# Patient Record
Sex: Male | Born: 1935 | Race: White | Hispanic: No | Marital: Married | Smoking: Former smoker
Health system: Southern US, Community
[De-identification: ages and names within clinical notes are randomized; demographics above are authoritative.]

## PROBLEM LIST (undated history)

## (undated) DIAGNOSIS — K5790 Diverticulosis of intestine, part unspecified, without perforation or abscess without bleeding: Secondary | ICD-10-CM

## (undated) DIAGNOSIS — K4090 Unilateral inguinal hernia, without obstruction or gangrene, not specified as recurrent: Secondary | ICD-10-CM

## (undated) DIAGNOSIS — N400 Enlarged prostate without lower urinary tract symptoms: Secondary | ICD-10-CM

## (undated) DIAGNOSIS — K219 Gastro-esophageal reflux disease without esophagitis: Secondary | ICD-10-CM

## (undated) DIAGNOSIS — K759 Inflammatory liver disease, unspecified: Secondary | ICD-10-CM

## (undated) DIAGNOSIS — B1910 Unspecified viral hepatitis B without hepatic coma: Secondary | ICD-10-CM

## (undated) DIAGNOSIS — N429 Disorder of prostate, unspecified: Secondary | ICD-10-CM

## (undated) DIAGNOSIS — F329 Major depressive disorder, single episode, unspecified: Secondary | ICD-10-CM

## (undated) DIAGNOSIS — G629 Polyneuropathy, unspecified: Secondary | ICD-10-CM

## (undated) DIAGNOSIS — I2510 Atherosclerotic heart disease of native coronary artery without angina pectoris: Secondary | ICD-10-CM

## (undated) DIAGNOSIS — M1990 Unspecified osteoarthritis, unspecified site: Secondary | ICD-10-CM

## (undated) DIAGNOSIS — F32A Depression, unspecified: Secondary | ICD-10-CM

## (undated) DIAGNOSIS — K635 Polyp of colon: Secondary | ICD-10-CM

## (undated) DIAGNOSIS — F419 Anxiety disorder, unspecified: Secondary | ICD-10-CM

## (undated) DIAGNOSIS — I10 Essential (primary) hypertension: Secondary | ICD-10-CM

## (undated) DIAGNOSIS — I519 Heart disease, unspecified: Secondary | ICD-10-CM

## (undated) DIAGNOSIS — E7800 Pure hypercholesterolemia, unspecified: Secondary | ICD-10-CM

## (undated) HISTORY — DX: Essential (primary) hypertension: I10

## (undated) HISTORY — DX: Pure hypercholesterolemia, unspecified: E78.00

## (undated) HISTORY — PX: PROSTATE BIOPSY: SHX241

## (undated) HISTORY — PX: CORONARY ANGIOPLASTY: SHX604

## (undated) HISTORY — PX: HERNIA REPAIR: SHX51

## (undated) HISTORY — PX: WISDOM TOOTH EXTRACTION: SHX21

## (undated) HISTORY — PX: TONSILLECTOMY: SUR1361

## (undated) HISTORY — PX: COLONOSCOPY: SHX174

## (undated) HISTORY — DX: Heart disease, unspecified: I51.9

## (undated) HISTORY — PX: OTHER SURGICAL HISTORY: SHX169

---

## 1959-06-07 HISTORY — PX: OTHER SURGICAL HISTORY: SHX169

## 2000-01-05 HISTORY — PX: CARDIAC CATHETERIZATION: SHX172

## 2004-04-15 ENCOUNTER — Ambulatory Visit: Payer: Self-pay | Admitting: Gastroenterology

## 2005-03-18 ENCOUNTER — Emergency Department: Payer: Self-pay | Admitting: General Practice

## 2007-06-07 HISTORY — PX: PICC LINE REMOVAL (ARMC HX): HXRAD1261

## 2007-06-07 HISTORY — PX: PICC LINE INSERTION: CATH118290

## 2008-04-10 ENCOUNTER — Emergency Department: Payer: Self-pay | Admitting: Emergency Medicine

## 2008-04-14 ENCOUNTER — Inpatient Hospital Stay: Payer: Self-pay | Admitting: Endocrinology

## 2008-07-24 ENCOUNTER — Ambulatory Visit: Payer: Self-pay | Admitting: Unknown Physician Specialty

## 2011-02-16 ENCOUNTER — Ambulatory Visit: Payer: Self-pay | Admitting: Sports Medicine

## 2011-06-18 ENCOUNTER — Ambulatory Visit: Payer: Self-pay | Admitting: Sports Medicine

## 2012-04-04 DIAGNOSIS — N403 Nodular prostate with lower urinary tract symptoms: Secondary | ICD-10-CM | POA: Insufficient documentation

## 2013-03-20 ENCOUNTER — Encounter: Payer: Self-pay | Admitting: Podiatry

## 2013-03-20 ENCOUNTER — Ambulatory Visit (INDEPENDENT_AMBULATORY_CARE_PROVIDER_SITE_OTHER): Payer: Medicare Other | Admitting: Podiatry

## 2013-03-20 VITALS — BP 168/90 | HR 66 | Resp 16 | Ht 70.0 in | Wt 185.0 lb

## 2013-03-20 DIAGNOSIS — G5760 Lesion of plantar nerve, unspecified lower limb: Secondary | ICD-10-CM

## 2013-03-20 DIAGNOSIS — G5762 Lesion of plantar nerve, left lower limb: Secondary | ICD-10-CM

## 2013-03-20 DIAGNOSIS — D219 Benign neoplasm of connective and other soft tissue, unspecified: Secondary | ICD-10-CM

## 2013-03-20 DIAGNOSIS — D3610 Benign neoplasm of peripheral nerves and autonomic nervous system, unspecified: Secondary | ICD-10-CM

## 2013-03-20 NOTE — Progress Notes (Signed)
Steven Bridges presents today with a chief complaint of the same kind of  pain that had of the fourth toe before. It feels like electrical zingers. He's attends for toes with curves around. He denies any trauma to the left foot.  Objective: Vital signs are stable he is alert and oriented x3. He has pain on palpation to the third interdigital space of the left foot. Hammertoe is noted to the fourth digit left foot. Pulses are palpable left.  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  Plan: Injection dehydrated alcohol third interdigital space today followup with him in one month. This was his first dehydrated alcohol injection.

## 2013-04-22 ENCOUNTER — Ambulatory Visit: Payer: Medicare Other | Admitting: Podiatry

## 2013-05-06 ENCOUNTER — Ambulatory Visit: Payer: Medicare Other | Admitting: Podiatry

## 2013-07-08 ENCOUNTER — Encounter: Payer: Self-pay | Admitting: Podiatry

## 2013-07-08 ENCOUNTER — Ambulatory Visit (INDEPENDENT_AMBULATORY_CARE_PROVIDER_SITE_OTHER): Payer: Medicare Other | Admitting: Podiatry

## 2013-07-08 VITALS — BP 125/74 | HR 78 | Resp 16 | Ht 70.0 in | Wt 188.0 lb

## 2013-07-08 DIAGNOSIS — G5760 Lesion of plantar nerve, unspecified lower limb: Secondary | ICD-10-CM

## 2013-07-08 DIAGNOSIS — G588 Other specified mononeuropathies: Secondary | ICD-10-CM

## 2013-07-08 MED ORDER — IBUPROFEN 600 MG PO TABS: 600.0000 mg | ORAL_TABLET | Freq: Three times a day (TID) | ORAL | Status: DC | PRN

## 2013-07-08 NOTE — Progress Notes (Signed)
Steven Bridges presents today for followup of his neuroma to the third interdigital space of the left foot. I have not seen him since October 2014. He states that I thought I was doing well so I did come back. I suppose I should have continued the therapy. He relates that the pain in his left foot has become worse and is even painful in bed at night keeping him from sleeping comfortably.  Objective: Pulses are strongly palpable to the left lower extremity today. There is no erythema edema cellulitis drainage or odor. Palpable Mulder's click to the third interdigital space of the left foot with severe pain.  Assessment: Neuroma third interdigital space left foot.  Plan: Started over with the dehydrated alcohol injections today to the third interdigital space of the left foot and I will followup with him in 3 weeks

## 2013-07-29 ENCOUNTER — Ambulatory Visit (INDEPENDENT_AMBULATORY_CARE_PROVIDER_SITE_OTHER): Payer: Medicare Other | Admitting: Podiatry

## 2013-07-29 VITALS — BP 129/75 | HR 79 | Resp 16 | Ht 70.0 in | Wt 190.0 lb

## 2013-07-29 DIAGNOSIS — G5760 Lesion of plantar nerve, unspecified lower limb: Secondary | ICD-10-CM

## 2013-07-29 DIAGNOSIS — G588 Other specified mononeuropathies: Secondary | ICD-10-CM

## 2013-07-29 NOTE — Progress Notes (Signed)
He states that based on her history I know we need another injection. He is referring to his neuroma to the third interdigital space of the left foot if which we started our dehydrated alcohol once again last visit.  Objective: Vital signs are stable he is alert and oriented x3. Pulses are palpable left foot. He has pain with a palpable Mulder's click to the third interdigital space of the left foot.  Assessment: Neuroma third interdigital space left foot.  Plan: Seconds injection of dehydrated alcohol third interdigital space left foot

## 2013-08-19 ENCOUNTER — Ambulatory Visit (INDEPENDENT_AMBULATORY_CARE_PROVIDER_SITE_OTHER): Payer: Medicare Other | Admitting: Podiatry

## 2013-08-19 VITALS — BP 142/75 | HR 69 | Resp 16 | Ht 70.0 in | Wt 183.0 lb

## 2013-08-19 DIAGNOSIS — G588 Other specified mononeuropathies: Secondary | ICD-10-CM

## 2013-08-19 DIAGNOSIS — D3610 Benign neoplasm of peripheral nerves and autonomic nervous system, unspecified: Secondary | ICD-10-CM

## 2013-08-19 DIAGNOSIS — D219 Benign neoplasm of connective and other soft tissue, unspecified: Secondary | ICD-10-CM

## 2013-08-19 DIAGNOSIS — G5760 Lesion of plantar nerve, unspecified lower limb: Secondary | ICD-10-CM

## 2013-08-19 NOTE — Progress Notes (Signed)
Steven Bridges presents today for followup of his neuroma to the third interdigital space of the left foot. He states it is doing much better I wonder flava need the injection.  Objective: Pulses are palpable left foot. He has pain on palpation with a palpable Mulder's click third interdigital space of the left foot.  Assessment: Neuroma third interdigital space left foot.  Third injection of dehydrated alcohol left foot. Followup with him in 3 weeks

## 2013-09-09 ENCOUNTER — Ambulatory Visit: Payer: Medicare Other | Admitting: Podiatry

## 2013-09-16 ENCOUNTER — Ambulatory Visit: Payer: Medicare Other | Admitting: Podiatry

## 2013-09-18 ENCOUNTER — Ambulatory Visit: Payer: Medicare Other | Admitting: Podiatry

## 2013-09-23 ENCOUNTER — Ambulatory Visit: Payer: Medicare Other | Admitting: Podiatry

## 2013-09-30 ENCOUNTER — Ambulatory Visit: Payer: Self-pay | Admitting: Unknown Physician Specialty

## 2013-10-02 LAB — PATHOLOGY REPORT

## 2013-10-03 ENCOUNTER — Ambulatory Visit: Payer: Medicare Other | Admitting: Podiatry

## 2013-10-07 DIAGNOSIS — R972 Elevated prostate specific antigen [PSA]: Secondary | ICD-10-CM | POA: Insufficient documentation

## 2013-10-10 ENCOUNTER — Encounter: Payer: Self-pay | Admitting: Podiatry

## 2013-10-10 ENCOUNTER — Ambulatory Visit (INDEPENDENT_AMBULATORY_CARE_PROVIDER_SITE_OTHER): Payer: Medicare Other | Admitting: Podiatry

## 2013-10-10 VITALS — BP 128/72 | HR 96 | Resp 16 | Wt 183.0 lb

## 2013-10-10 DIAGNOSIS — G5760 Lesion of plantar nerve, unspecified lower limb: Secondary | ICD-10-CM

## 2013-10-10 DIAGNOSIS — D3610 Benign neoplasm of peripheral nerves and autonomic nervous system, unspecified: Secondary | ICD-10-CM

## 2013-10-10 DIAGNOSIS — G588 Other specified mononeuropathies: Secondary | ICD-10-CM

## 2013-10-11 NOTE — Progress Notes (Signed)
He presents today for followup of his neuroma third interdigital space of his left foot. He states that it seems to be 100% better.  Objective: Pulses are palpable bilateral. No pain on palpation third interdigital space of the left foot.  Assessment: Well-healing neuroma third interdigital space left.  Plan: Followup with me on an as-needed basis.

## 2014-06-26 ENCOUNTER — Ambulatory Visit (INDEPENDENT_AMBULATORY_CARE_PROVIDER_SITE_OTHER): Payer: Medicare Other | Admitting: Podiatry

## 2014-06-26 VITALS — BP 135/69 | HR 67 | Resp 16

## 2014-06-26 DIAGNOSIS — D3610 Benign neoplasm of peripheral nerves and autonomic nervous system, unspecified: Secondary | ICD-10-CM

## 2014-06-26 DIAGNOSIS — G5762 Lesion of plantar nerve, left lower limb: Secondary | ICD-10-CM

## 2014-06-26 NOTE — Patient Instructions (Signed)
Morton's Neuroma in Sports  (Interdigital Plantar Neuroma) Morton's neuroma is a condition of the nervous system that results in pain or loss of feeling in the toes. The disease is caused by the bones of the foot squeezing the nerve that runs between two toes (interdigital nerve). The third and fourth toes are most likely to be affected by this disease. SYMPTOMS   Tingling, numbness, burning, or electric shocks in the front of the foot, often involving the third and fourth toes, although it may involve any other pair of toes.  Pain and tenderness in the front of the foot, that gets worse when walking.  Pain that gets worse when pressure is applied to the foot (wearing shoes).  Severe pain in the front of the foot, when standing on the front of the foot (on tiptoes), such as with running, jumping, pivoting, or dancing. CAUSES  Morton's neuroma is caused by swelling of the nerve between two toes. This swelling causes the nerve to be pinched between the bones of the foot. RISK INCREASES WITH:  Recurring foot or ankle injuries.  Poor fitting or worn shoes, with minimal padding and shock absorbers.  Loose ligaments of the foot, causing thickening of the nerve.  Poor foot strength and flexibility. PREVENTION  Warm up and stretch properly before activity.  Maintain physical fitness:  Foot and ankle flexibility.  Muscle strength and endurance.  Cardiovascular fitness.  Wear properly fitted and padded shoes.  Wear arch supports (orthotics), when needed. PROGNOSIS  If treated properly, Morton's neuroma can usually be cured with non-surgical treatment. For certain cases, surgery may be needed. RELATED COMPLICATIONS  Permanent numbness and pain in the foot.  Inability to participate in athletics, because of pain. TREATMENT Treatment first involves stopping any activities that make the symptoms worse. The use of ice and medicine will help reduce pain and inflammation. Wearing shoes  with a wide toe box, and an orthotic arch support or metatarsal bar, may also reduce pain. Your caregiver may give you a corticosteroid injection, to further reduce inflammation. If non-surgical treatment is unsuccessful, surgery may be needed. Surgery to fix Morton's neuroma is often performed as an outpatient procedure, meaning you can go home the same day as the surgery. The procedure involves removing the source of pressure on the nerve. If it is necessary to remove the nerve, you can expect persistent numbness. MEDICATION  If pain medicine is needed, nonsteroidal anti-inflammatory medicines (aspirin and ibuprofen), or other minor pain relievers (acetaminophen), are often advised.  Do not take pain medicine for 7 days before surgery.  Prescription pain relievers are usually prescribed only after surgery. Use only as directed and only as much as you need.  Corticosteroid injections are used in extreme cases, to reduce inflammation. These injections should be done only if necessary, because they may be given only a limited number of times. HEAT AND COLD  Cold treatment (icing) should be applied for 10 to 15 minutes every 2 to 3 hours for inflammation and pain, and immediately after activity that aggravates your symptoms. Use ice packs or an ice massage.  Heat treatment may be used before performing stretching and strengthening activities prescribed by your caregiver, physical therapist, or athletic trainer. Use a heat pack or a warm water soak. SEEK MEDICAL CARE IF:   Symptoms get worse or do not improve in 2 weeks, despite treatment.  After surgery you develop increasing pain, swelling, redness, increased warmth, bleeding, drainage of fluids, or fever.  New, unexplained symptoms develop. (  Drugs used in treatment may produce side effects.) Document Released: 03/30/2005 Document Revised: 08/15/2011 Document Reviewed: 09/04/2008 Exit[employer] Patient Information 2015 [employer]. This  information is not intended to replace advice given to you by your health care provider. Make sure you discuss any questions you have with your health care provider.  

## 2014-06-26 NOTE — Progress Notes (Signed)
Patient ID: Beni Turrell [facility] Sr., male   DOB: 03-May-1935, 80 y.o.   MRN: [id number]  Subjective: 80 year old male presents the office today with complaints of left fourth toe numbness and pain in the third interspace and the left foot. He states that last night he started having increased symptoms over this area. He states that the pain pills exactly the same as it did before when he was treated for neuroma. He denies any recent injury or trauma to the area. He states that he has numbness to his third and fourth toes. Also he states that he believes that his neuropathy is getting worse. 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 No other complaints at this time.  Objective: AAO 3, NAD DP/PT pulses palpable bilaterally, CRT less than 3 seconds Protective sensation appears to be intact with Derrel Nip monofilament, vibratory sensation is altered, Achilles tendon reflex intact. There is tenderness to palpation along the left third interspace with reproduction of symptoms of numbness into the third and fourth toe. There is not appear to be any reproduction the medial to lateral compression. No definitive neuroma is palpable. There is no areas of pinpoint bony tenderness or pain with vibratory sensation over the metatarsals or digits or to any other areas of the foot bilaterally. There is no overlying edema, erythema, increase in warmth. MMT 5/5, ROM WNL No open lesions or pre-ulcerative lesions. No pain with calf compression, swelling, warmth, erythema.  Assessment: 80 year old male with left third interspace likely neuroma  Plan: -Discussed x-rays however patient wishes to hold off as he states that this is the same pain that he had before with a neuroma. -Discussed various treatment options. At this time elected to proceed with an alcohol sclerosing injection which he had previously. Risks and complications of the injection were discussed the patient and he he verbally consents to the  procedure. Under sterile conditions a dehydrated alcohol injection was infiltrated into the left third interspace. Band-Aid was applied. Patient tolerated the injection well without complications. Post injection care was discussed with the patient. -Neuroma pads were dispensed. -Discussed shoe gear modifications. -Discussed possible treatments for worsening neuropathy. He wishes to hold off at this time. -Follow-up in 4 weeks or sooner should any problems arise. In the meantime occurs call the office with any questions, concerns, change in symptoms.

## 2014-07-24 ENCOUNTER — Encounter: Payer: Self-pay | Admitting: Podiatry

## 2014-07-24 ENCOUNTER — Ambulatory Visit (INDEPENDENT_AMBULATORY_CARE_PROVIDER_SITE_OTHER): Payer: Medicare Other | Admitting: Podiatry

## 2014-07-24 VITALS — BP 143/80 | HR 66 | Resp 18

## 2014-07-24 DIAGNOSIS — G629 Polyneuropathy, unspecified: Secondary | ICD-10-CM

## 2014-07-24 DIAGNOSIS — D3610 Benign neoplasm of peripheral nerves and autonomic nervous system, unspecified: Secondary | ICD-10-CM | POA: Diagnosis not present

## 2014-07-24 MED ORDER — TRIAMCINOLONE ACETONIDE 10 MG/ML IJ SUSP
10.0000 mg | Freq: Once | INTRAMUSCULAR | Status: AC
  Administered 2014-07-24: 10 mg

## 2014-07-25 NOTE — Progress Notes (Signed)
Patient ID: Steven Bridges [facility] Sr., male   DOB: 01/08/1935, 80 y.o.   MRN: [id number]  Subjective: 80 year old male presents the office today for continued evaluation of left fourth toe pain/numbness. He states that after the last injection at resolution of symptoms for a couple weeks however he stated last week he started to have some "zingers" to the fourth toe. He denies any history of injury or trauma to the area recently. He also feels that his neuropathy has been continuing to worsen. He was previously placed on gabapentin however he did not like the side effects. He has not wanted to go on any other medications. No other complaints at this time in no acute changes since last appointment.  Objective: AAO 3, NAD DP/PT pulses palpable, CRT less than 3 seconds Protective sensation mildly decreased with Simms Weinstein monofilament, vibratory sensation is altered, Achilles tendon reflex intact. There is tenderness along the third interspace 3 production symptoms of numbness into the fourth toe particularly. There is no palpable neuroma identified at this time. There is no pain with medial to lateral compression of the metatarsals. There is no areas of pinpoint bony tenderness or pain the vibratory sensation along the metatarsals or digits. There is no pain with MTPJ range of motion. There is mild discomfort along the medial band of the plantar fascia within the arch of the foot. Plantar fascia appears to be intact. There is no pain along the insertion of the calcaneus. There is no overlying edema, erythema, increased warmth. No open lesions or pre-ulcer lesions identified. No pain with calf compression, swelling, warmth, erythema.  Assessment: 80 year old male with likely left third interspace neuroma however it does appear to be resolving  Plan: -Treatment options were discussed including alternatives, risks, complications. -At this time as she has had numerous alcohol sclerosing injections  into the left third interspace discussed possible steroid injection into the area to see if this helps alleviate his symptoms. Risks and complications of injection were discussed the patient for which he understands and verbally consents. Under sterile conditions a total of 1 mL mixture of Kenalog and 0.5% Marcaine plain was infiltrated into the area of maximal tenderness along the third interspace. Then he was applied. Patient tolerated the injection well without complications. Postinjection care was discussed the patient. -Discussed stretching exercises to help with what appears to be some plantar fascial symptoms as well. Discussed shoe gear modifications. -Again had a long discussion with patient in regards neuropathy treatment. He does not want any medical treatment for the neuropathy this time. Recommended follow-up with his primary care physician for this. -Follow-up as needed. In the meantime occurs all the office with questions, concerns, change in symptoms.

## 2014-08-21 ENCOUNTER — Encounter: Payer: Self-pay | Admitting: Podiatry

## 2014-08-21 ENCOUNTER — Ambulatory Visit (INDEPENDENT_AMBULATORY_CARE_PROVIDER_SITE_OTHER): Payer: Medicare Other | Admitting: Podiatry

## 2014-08-21 VITALS — BP 140/80 | HR 66 | Resp 16

## 2014-08-21 DIAGNOSIS — G629 Polyneuropathy, unspecified: Secondary | ICD-10-CM | POA: Diagnosis not present

## 2014-08-21 NOTE — Patient Instructions (Signed)
Will put in a referral for [facility]. If you have not heard about scheduling in the next few days, please call the office.

## 2014-08-21 NOTE — Progress Notes (Signed)
Patient ID: Steven Bridges [facility] Sr., male   DOB: 1935-05-08, 80 y.o.   MRN: [id number]  Subjective: Steven Bridges presents the office today stating that his neuropathy is been progressive over the last 6 weeks. He states that he is starting to have numbness and tingling up to the level of the ankle to both of his feet. He states that he believes this may have been aggravated by the Xanax which she was on previously or the Lipitor. He states that he has stopped the Lipitor over the last week for concerns that it is causing the neuropathy or making it worse. He has not talked to his PCP about this. He is inquiring about other treatments for neuropathy. He previously was on gabapentin although he did not like the side effects and did not continue the medication. He was previous a treated for neuroma on the left foot for which she states that those symptoms have improved. No tenderness over that site at this time. No other complaints at this time.  Objective: AAO 3, NAD DP/PT pulses palpable bilaterally, CRT less than 3 seconds Decreased protective sensation with Simms Weinstein monofilament, decreased vibratory sensation, Achilles tendon reflex intact. There is no areas of pinpoint bony tenderness or pain with vibratory sensation of bilateral lower extremity. There is no overlying edema, erythema, increase in warmth bilaterally. There is no tenderness to palpation over on the intermetatarsal spaces and there is no palpable neuroma identified at this time. There is no pain with medial to lateral compression of the metatarsals bilaterally. MMT 5/5, ROM WNL No open lesions or pre-ulcer lesions identified bilaterally.  No pain with calf compression, swelling, warmth, erythema.  Assessment: 80 year old male with worsening neuropathy symptoms.  Plan: -Treatment options were discussed with the patient going alternatives, risks, complications. -Patient was inquiring about other options for her peripheral  neuropathy. He states that he found a clinic at [facility] for neuropathy. He is also inquiring about possible laser and various ultrasound therapies. At this time we cannot offer these therapies and I discussed with him where he could pursue these if he desired. However given the worsening symptoms I recommend a neurology evaluation. A referral was placed for [facility]. -Strongly recommended the patient to talked his primary care physician before stopping any medications. -Follow-up after neurology evaluation or sooner if any problems are to arise. In the meantime encouraged to call the office with any questions, concerns, changes symptoms.

## 2014-09-03 DIAGNOSIS — R350 Frequency of micturition: Secondary | ICD-10-CM | POA: Insufficient documentation

## 2014-09-03 DIAGNOSIS — R339 Retention of urine, unspecified: Secondary | ICD-10-CM | POA: Insufficient documentation

## 2014-09-09 DIAGNOSIS — N3080 Other cystitis without hematuria: Secondary | ICD-10-CM | POA: Insufficient documentation

## 2015-06-06 ENCOUNTER — Encounter: Payer: Self-pay | Admitting: Emergency Medicine

## 2015-06-06 ENCOUNTER — Ambulatory Visit
Admission: EM | Admit: 2015-06-06 | Discharge: 2015-06-06 | Disposition: A | Discharge status: Home / Self Care | Payer: Medicare Other | Attending: Family Medicine | Admitting: Family Medicine

## 2015-06-06 DIAGNOSIS — J069 Acute upper respiratory infection, unspecified: Secondary | ICD-10-CM | POA: Diagnosis not present

## 2015-06-06 MED ORDER — AZITHROMYCIN 250 MG PO TABS: ORAL_TABLET | ORAL | Status: DC

## 2015-06-06 NOTE — ED Provider Notes (Signed)
Patient presents today with symptoms of mild productive cough, nasal congestion. He's had the symptoms for the last few days. He states that the mucus is thick and colored. He requests a Z-Pak which she took at the beginning of the month for similar symptoms which cleared up. He denies any fever, chest pain, shortness of breath, severe headache, nausea, vomiting, diarrhea, abdominal pain. He is a former smoker. He denies any history of COPD or asthma.  ROS: Negative except mentioned above.  Vitals as per Epic.  GENERAL: NAD HEENT: mild pharyngeal erythema, no exudate, no frontal or maxillary sinus tenderness, no cervical LAD RESP: CTA B CARD: RRR   A/P: URI- Will treat with Z-pk, Claritin prn, Delysm prn, rest, hydration, seek medical attention if symptoms persist or worsen as discussed.  [name], MD 06/06/15 [phone]

## 2015-06-06 NOTE — ED Notes (Signed)
Cough, congested, runny nose, sore throat for 2 days. (Recurrent)

## 2015-08-31 ENCOUNTER — Ambulatory Visit: Payer: Medicare Other

## 2015-08-31 ENCOUNTER — Ambulatory Visit (INDEPENDENT_AMBULATORY_CARE_PROVIDER_SITE_OTHER): Payer: Medicare Other | Admitting: Podiatry

## 2015-08-31 ENCOUNTER — Ambulatory Visit (INDEPENDENT_AMBULATORY_CARE_PROVIDER_SITE_OTHER): Payer: Medicare Other

## 2015-08-31 ENCOUNTER — Telehealth: Payer: Self-pay | Admitting: *Deleted

## 2015-08-31 DIAGNOSIS — I872 Venous insufficiency (chronic) (peripheral): Secondary | ICD-10-CM | POA: Diagnosis not present

## 2015-08-31 DIAGNOSIS — M79672 Pain in left foot: Secondary | ICD-10-CM | POA: Diagnosis not present

## 2015-08-31 DIAGNOSIS — M79671 Pain in right foot: Secondary | ICD-10-CM | POA: Diagnosis not present

## 2015-08-31 NOTE — Progress Notes (Signed)
He presents today with a very recent onset of swelling to his left foot. He states they were both slightly swollen but nothing like this. He states that he thinks he may have overdone it in the yard since he likes to work in the yard a lot. He denies any history of heart disease or kidney disease. But does relate that he has just been placed on an increased dose of his fluid pill.  Objective: Vital signs are stable alert and oriented 3. Pulses are palpable. He has pitting edema to the left lower extremity below the level of the calf and out along the dorsal aspect of the foot. He has no calf pain. Pulses are palpable neurologic sensorium is diminished for Semmes-Weinstein monofilament. Radiographs do not demonstrate any type of osseus abnormalities. No open lesions or wounds.  Assessment: Probable venous insufficiency of the left lower extremity cannot rule out congestive heart issues.  Plan: We are going to send him to vascular for an evaluation of his venous return and I will follow-up with him in the near future.

## 2015-08-31 NOTE — Telephone Encounter (Addendum)
-----   Message from Rip Harbour, [facility] sent at 08/31/2015  9:41 AM EDT ----- Regarding: Vascular studies Patient needs orders and scheduling for venous dopplers and vascular consult with [facility].   Dx: venous insuffiencey bilateral   Thanks!!   08/31/2015-Faxed doppler orders and consultation orders to  vein and Vascular.  09/09/2015-Left message to fax doppler results to [phone].

## 2015-09-07 ENCOUNTER — Ambulatory Visit: Payer: Medicare Other | Admitting: Podiatry

## 2015-09-08 NOTE — Telephone Encounter (Signed)
09/08/2015- patient called asking for nurse to call him back with update on the vein study that he had done at [place] on 08/31/15. Patient wanted nurse to call him with results.  [phone].

## 2015-09-10 ENCOUNTER — Telehealth: Payer: Self-pay | Admitting: *Deleted

## 2015-09-10 DIAGNOSIS — R609 Edema, unspecified: Secondary | ICD-10-CM

## 2015-09-10 NOTE — Telephone Encounter (Addendum)
Dr. 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 I informed pt's wife, Donnajean Lopes of the results and recommendations, because pt was on the line with another doctor's office.  Referral to [facility] [place].  Referral, pt clinical and demographics faxed to [facility].

## 2015-09-16 ENCOUNTER — Telehealth: Payer: Self-pay | Admitting: *Deleted

## 2015-09-16 NOTE — Telephone Encounter (Addendum)
Pt request his doppler results to be sent to Dr. Rogers Blocker and Dr. Marry Guan at fax [phone].  09/17/2015-I spoke with receptionist at [facility] and she stated fax to Vance Peper, PA that is who will be seeing pt on Tuesday 09/22/2015.  I faxed to Vance Peper, PA with pt's request to have doppler results shared with Dr. Skip Estimable.

## 2015-09-17 ENCOUNTER — Telehealth: Payer: Self-pay | Admitting: Podiatry

## 2015-09-17 NOTE — Telephone Encounter (Signed)
Patient called asking for a copy of his vascular study from [facility]. He wants to send it to Dr. Rogers Blocker at [facility]. He is coming by today to sign a release so that we can fax it for him. Could you please fax a copy of that vascular report so that I can do that for him once he signs the release?  Thanks!

## 2015-09-25 ENCOUNTER — Telehealth: Payer: Self-pay | Admitting: Podiatry

## 2015-09-25 NOTE — Telephone Encounter (Signed)
Pt called in said wanting to know if he can get a different RX the RX Dr. Milinda Pointer gave him made his leg swell (Gabapentin)

## 2015-09-28 NOTE — Telephone Encounter (Signed)
Unfortunately if gabapentin is causing swelling then Lyrica will as well.  I would consider cutting the dose in half or stopping it all together.  No other good alternatives.

## 2015-09-28 NOTE — Telephone Encounter (Addendum)
Pt request change in medication the Gabapentin is causing his legs to swell.  I spoke with pt and gave Dr. Stephenie Acres recommendation.  Pt states he takes Gabapentin 400mg  capsules 3 times a day, and he can't half, so I suggest take the morning and evening doses and see how this affected him.  Pt agreed.

## 2015-09-28 NOTE — Telephone Encounter (Signed)
Yes that is a great idea.

## 2015-10-12 ENCOUNTER — Ambulatory Visit (INDEPENDENT_AMBULATORY_CARE_PROVIDER_SITE_OTHER): Payer: Medicare Other | Admitting: Podiatry

## 2015-10-12 ENCOUNTER — Encounter: Payer: Self-pay | Admitting: Podiatry

## 2015-10-12 VITALS — BP 152/90 | HR 69 | Resp 12

## 2015-10-12 DIAGNOSIS — M779 Enthesopathy, unspecified: Principal | ICD-10-CM

## 2015-10-12 DIAGNOSIS — M7751 Other enthesopathy of right foot: Secondary | ICD-10-CM

## 2015-10-12 DIAGNOSIS — M778 Other enthesopathies, not elsewhere classified: Secondary | ICD-10-CM

## 2015-10-14 NOTE — Progress Notes (Signed)
He presents today for follow-up capsulitis forefoot left. He states it is been doing a little better.  Objective: Vital signs stable alert and oriented 3. Pulses are palpable. Neurologic sensorium is intact. Pain on palpation first and second metatarsophalangeal joints left foot.  Assessment: Pain in limb secondary to capsulitis.  Plan: Injected the area today with Kenalog and local anesthetic. Follow-up with him as needed.

## 2015-11-16 ENCOUNTER — Encounter: Payer: Self-pay | Admitting: Podiatry

## 2015-11-16 ENCOUNTER — Ambulatory Visit (INDEPENDENT_AMBULATORY_CARE_PROVIDER_SITE_OTHER): Payer: Medicare Other | Admitting: Podiatry

## 2015-11-16 DIAGNOSIS — M7751 Other enthesopathy of right foot: Secondary | ICD-10-CM | POA: Diagnosis not present

## 2015-11-16 DIAGNOSIS — M778 Other enthesopathies, not elsewhere classified: Secondary | ICD-10-CM

## 2015-11-16 DIAGNOSIS — M779 Enthesopathy, unspecified: Principal | ICD-10-CM

## 2015-11-16 NOTE — Progress Notes (Signed)
He presents today for follow-up of capsulitis to the forefoot right. He states that he stopped the gabapentin because his legs are swelling. He states that the capsulitis over treating him for right foot has resolved 100%.  Objective: Vital signs are stable alert and oriented 3. Pulses are palpable. No pain on palpation or range of motion of the second metatarsal phalangeal joint right.  Assessment: Forefoot capsulitis resolved 100%.  Plan: Follow up with me as needed.

## 2015-11-24 ENCOUNTER — Encounter: Payer: Self-pay | Admitting: Podiatry

## 2016-02-23 DIAGNOSIS — R300 Dysuria: Secondary | ICD-10-CM | POA: Insufficient documentation

## 2016-03-09 ENCOUNTER — Emergency Department
Admission: EM | Admit: 2016-03-09 | Discharge: 2016-03-09 | Disposition: A | Discharge status: Home / Self Care | Payer: Medicare Other | Attending: Student in an Organized Health Care Education/Training Program | Admitting: Student in an Organized Health Care Education/Training Program

## 2016-03-09 ENCOUNTER — Emergency Department: Payer: Medicare Other

## 2016-03-09 ENCOUNTER — Encounter: Payer: Self-pay | Admitting: Emergency Medicine

## 2016-03-09 DIAGNOSIS — R5383 Other fatigue: Secondary | ICD-10-CM | POA: Diagnosis not present

## 2016-03-09 DIAGNOSIS — Z79899 Other long term (current) drug therapy: Secondary | ICD-10-CM | POA: Insufficient documentation

## 2016-03-09 DIAGNOSIS — Z87891 Personal history of nicotine dependence: Secondary | ICD-10-CM | POA: Insufficient documentation

## 2016-03-09 DIAGNOSIS — E860 Dehydration: Secondary | ICD-10-CM | POA: Diagnosis not present

## 2016-03-09 DIAGNOSIS — I10 Essential (primary) hypertension: Secondary | ICD-10-CM | POA: Insufficient documentation

## 2016-03-09 DIAGNOSIS — Z7982 Long term (current) use of aspirin: Secondary | ICD-10-CM | POA: Insufficient documentation

## 2016-03-09 DIAGNOSIS — R531 Weakness: Secondary | ICD-10-CM

## 2016-03-09 HISTORY — DX: Anxiety disorder, unspecified: F41.9

## 2016-03-09 HISTORY — DX: Disorder of prostate, unspecified: N42.9

## 2016-03-09 LAB — BASIC METABOLIC PANEL
Anion gap: 9 (ref 5–15)
BUN: 13 mg/dL (ref 6–20)
CHLORIDE: 96 mmol/L — AB (ref 101–111)
CO2: 26 mmol/L (ref 22–32)
Calcium: 9.1 mg/dL (ref 8.9–10.3)
Creatinine, Ser: 0.98 mg/dL (ref 0.61–1.24)
GFR calc Af Amer: >60 mL/min (ref >60)
GFR calc non Af Amer: >60 mL/min (ref >60)
GLUCOSE: 92 mg/dL (ref 65–99)
POTASSIUM: 4.4 mmol/L (ref 3.5–5.1)
Sodium: 131 mmol/L — ABNORMAL LOW (ref 135–145)

## 2016-03-09 LAB — CBC
HEMATOCRIT: 46.5 % (ref 40.0–52.0)
Hemoglobin: 16.2 g/dL (ref 13.0–18.0)
MCH: 34.6 pg — ABNORMAL HIGH (ref 26.0–34.0)
MCHC: 35 g/dL (ref 32.0–36.0)
MCV: 99 fL (ref 80.0–100.0)
Platelets: 227 10*3/uL (ref 150–440)
RBC: 4.7 MIL/uL (ref 4.40–5.90)
RDW: 13.2 % (ref 11.5–14.5)
WBC: 6.4 10*3/uL (ref 3.8–10.6)

## 2016-03-09 LAB — URINALYSIS COMPLETE WITH MICROSCOPIC (ARMC ONLY)
BACTERIA UA: NONE SEEN
Bilirubin Urine: NEGATIVE
GLUCOSE, UA: NEGATIVE mg/dL
HGB URINE DIPSTICK: NEGATIVE
Ketones, ur: NEGATIVE mg/dL
Leukocytes, UA: NEGATIVE
Nitrite: NEGATIVE
PROTEIN: NEGATIVE mg/dL
SQUAMOUS EPITHELIAL / LPF: NONE SEEN
Specific Gravity, Urine: 1.008 (ref 1.005–1.030)
pH: 7 (ref 5.0–8.0)

## 2016-03-09 LAB — HEPATIC FUNCTION PANEL
ALBUMIN: 3.9 g/dL (ref 3.5–5.0)
ALK PHOS: 85 U/L (ref 38–126)
ALT: 17 U/L (ref 17–63)
AST: 22 U/L (ref 15–41)
Bilirubin, Direct: 0.2 mg/dL (ref 0.1–0.5)
Indirect Bilirubin: 0.7 mg/dL (ref 0.3–0.9)
TOTAL PROTEIN: 7.3 g/dL (ref 6.5–8.1)
Total Bilirubin: 0.9 mg/dL (ref 0.3–1.2)

## 2016-03-09 LAB — TROPONIN I: Troponin I: <0.03 ng/mL (ref <0.03)

## 2016-03-09 LAB — TSH: TSH: 1.077 u[IU]/mL (ref 0.350–4.500)

## 2016-03-09 MED ORDER — SODIUM CHLORIDE 0.9 % IV BOLUS (SEPSIS)
1000.0000 mL | Freq: Once | INTRAVENOUS | Status: AC
  Administered 2016-03-09: 1000 mL via INTRAVENOUS

## 2016-03-09 NOTE — ED Notes (Signed)
Lab notified to add on troponin  

## 2016-03-09 NOTE — ED Provider Notes (Signed)
[facility] Emergency Department Provider Note    None    (approximate)  I have reviewed the triage vital signs and the nursing notes.   HISTORY  Chief Complaint Weakness    HPI Steven Fiebelkorn [facility] Sr. is a 81 y.o. male with recently diagnosed anxiety on Xanax as well as Prozac presents with 24 hours of worsening generalized fatigue and weakness. 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 Vital acuity to go to sleep. Was able to ambulate but felt like he is about to pass out while walking. Denies any shortness of breath or chest pain. No chest pressure. No nausea or vomiting. No numbness or tingling. States he has a history of CAD status post stents and states that the week preceding the heart attack he did feel weak. Denies any lower extremity swelling. No orthopnea. No recent cough. No melena. No diarrhea.  Past Medical History:  Diagnosis Date  . Anxiety   . HBP (high blood pressure)   . Heart trouble   . High cholesterol   . Prostate disorder     Patient Active Problem List   Diagnosis Date Noted  . Neuroma 03/20/2013    Past Surgical History:  Procedure Laterality Date  . COLONOSCOPY    . HEART STENTS    . HERNIA REPAIR     X 4  . LEFT HAND  1961    Prior to Admission medications   Medication Sig Start Date End Date Taking? Authorizing Provider  aspirin 81 MG tablet Take 81 mg by mouth daily.    Historical Provider, MD  atorvastatin (LIPITOR) 10 MG tablet Take 10 mg by mouth daily.    Historical Provider, MD  azithromycin (ZITHROMAX Z-PAK) 250 MG tablet Use as directed for 5 days. 06/06/15   [name], MD  Calcium Citrate (CITRACAL PO) Take by mouth 2 (two) times daily.    Historical Provider, MD  Calcium-Vitamin D (CALTRATE 600 PLUS-VIT D PO) Take by mouth 2 (two) times daily.    Historical Provider, MD  captopril (CAPOTEN) 25 MG tablet Take 25 mg by mouth 2 (two) times daily.     Historical Provider, MD  hydrochlorothiazide (MICROZIDE) 12.5 MG capsule Take 25 mg by mouth daily.     Historical Provider, MD  ibuprofen (ADVIL,MOTRIN) 600 MG tablet Take 1 tablet (600 mg total) by mouth every 8 (eight) hours as needed. 07/08/13   Max T Hyatt, DPM  Lansoprazole (PREVACID PO) Take by mouth. TAKE ONE TABLET ONCE WEEKLY    Historical Provider, MD  Multiple Vitamins-Minerals (CENTRUM SILVER PO) Take by mouth daily.    Historical Provider, MD  nitroGLYCERIN (NITROSTAT) 0.3 MG SL tablet Place 0.3 mg under the tongue as needed for chest pain.    Historical Provider, MD  sildenafil (VIAGRA) 100 MG tablet Take 100 mg by mouth as needed for erectile dysfunction.    Historical Provider, MD  tamsulosin (FLOMAX) 0.4 MG CAPS capsule Take by mouth daily.    Historical Provider, MD  Verapamil HCl CR 200 MG CP24 Take by mouth daily.    Historical Provider, MD    Allergies Review of patient's allergies indicates no known allergies.  [place]: no bleeding disorders  Social History Social History  Substance Use Topics  . Smoking status: Former Smoker    Types: Cigars    Quit date: 12/13/2012  . Smokeless tobacco: Former Systems developer    Quit date: 03/20/1988  .  Alcohol use Yes     Comment: OCCASIONALLY    Review of Systems Patient denies headaches, rhinorrhea, blurry vision, numbness, shortness of breath, chest pain, edema, cough, abdominal pain, nausea, vomiting, diarrhea, dysuria, fevers, rashes or hallucinations unless otherwise stated above in HPI. ____________________________________________   PHYSICAL EXAM:  VITAL SIGNS: Vitals:   03/09/16 1630 03/09/16 1636  BP: (!) 151/81 (!) 151/81  Pulse: 65 67  Resp: 12   Temp:      Constitutional: Alert and oriented. Well appearing and in no acute distress. Eyes: Conjunctivae are normal. PERRL. EOMI. Head: Atraumatic. Nose: No congestion/rhinnorhea. Mouth/Throat: Mucous membranes are moist.  Oropharynx non-erythematous. Neck: No stridor.  Painless ROM. No cervical spine tenderness to palpation Hematological/Lymphatic/Immunilogical: No cervical lymphadenopathy. Cardiovascular: Normal rate, regular rhythm. Grossly normal heart sounds.  Good peripheral circulation. Respiratory: Normal respiratory effort.  No retractions. Lungs CTAB. Gastrointestinal: Soft and nontender. No distention. No abdominal bruits. No CVA tenderness. Genitourinary:  Musculoskeletal: No lower extremity tenderness nor edema.  No joint effusions. Neurologic:  Normal speech and language. No gross focal neurologic deficits are appreciated. No gait instability. Skin:  Skin is warm, dry and intact. No rash noted. Psychiatric: Mood and affect are normal. Speech and behavior are normal.  ____________________________________________   LABS (all labs ordered are listed, but only abnormal results are displayed)  Results for orders placed or performed during the hospital encounter of 03/09/16 (from the past 24 hour(s))  Basic metabolic panel     Status: Abnormal   Collection Time: 03/09/16 12:52 PM  Result Value Ref Range   Sodium 131 (L) 135 - 145 mmol/L   Potassium 4.4 3.5 - 5.1 mmol/L   Chloride 96 (L) 101 - 111 mmol/L   CO2 26 22 - 32 mmol/L   Glucose, Bld 92 65 - 99 mg/dL   BUN 13 6 - 20 mg/dL   Creatinine, Ser 0.98 0.61 - 1.24 mg/dL   Calcium 9.1 8.9 - 10.3 mg/dL   GFR calc non Af Amer >60 >60 mL/min   GFR calc Af Amer >60 >60 mL/min   Anion gap 9 5 - 15  CBC     Status: Abnormal   Collection Time: 03/09/16 12:52 PM  Result Value Ref Range   WBC 6.4 3.8 - 10.6 K/uL   RBC 4.70 4.40 - 5.90 MIL/uL   Hemoglobin 16.2 13.0 - 18.0 g/dL   HCT 46.5 40.0 - 52.0 %   MCV 99.0 80.0 - 100.0 fL   MCH 34.6 (H) 26.0 - 34.0 pg   MCHC 35.0 32.0 - 36.0 g/dL   RDW 13.2 11.5 - 14.5 %   Platelets 227 150 - 440 K/uL  Hepatic function panel     Status: None   Collection Time: 03/09/16 12:52 PM  Result Value Ref Range   Total Protein 7.3 6.5 - 8.1 g/dL   Albumin 3.9  3.5 - 5.0 g/dL   AST 22 15 - 41 U/L   ALT 17 17 - 63 U/L   Alkaline Phosphatase 85 38 - 126 U/L   Total Bilirubin 0.9 0.3 - 1.2 mg/dL   Bilirubin, Direct 0.2 0.1 - 0.5 mg/dL   Indirect Bilirubin 0.7 0.3 - 0.9 mg/dL  TSH     Status: None   Collection Time: 03/09/16 12:52 PM  Result Value Ref Range   TSH 1.077 0.350 - 4.500 uIU/mL  Troponin I     Status: None   Collection Time: 03/09/16 12:52 PM  Result Value Ref Range   Troponin  I <0.03 <0.03 ng/mL  Urinalysis complete, with microscopic     Status: Abnormal   Collection Time: 03/09/16  2:35 PM  Result Value Ref Range   Color, Urine YELLOW (A) YELLOW   APPearance CLEAR (A) CLEAR   Glucose, UA NEGATIVE NEGATIVE mg/dL   Bilirubin Urine NEGATIVE NEGATIVE   Ketones, ur NEGATIVE NEGATIVE mg/dL   Specific Gravity, Urine 1.008 1.005 - 1.030   Hgb urine dipstick NEGATIVE NEGATIVE   pH 7.0 5.0 - 8.0   Protein, ur NEGATIVE NEGATIVE mg/dL   Nitrite NEGATIVE NEGATIVE   Leukocytes, UA NEGATIVE NEGATIVE   RBC / HPF 0-5 0 - 5 RBC/hpf   WBC, UA 0-5 0 - 5 WBC/hpf   Bacteria, UA NONE SEEN NONE SEEN   Squamous Epithelial / LPF NONE SEEN NONE SEEN   Mucous PRESENT   Troponin I     Status: None   Collection Time: 03/09/16  4:02 PM  Result Value Ref Range   Troponin I <0.03 <0.03 ng/mL   ____________________________________________  EKG My review and personal interpretation at Time: 12:45   Indication: weakness  Rate: 75  Rhythm: nsr Axis: normal Other: normal intervals, no acuite ischemia ____________________________________________  RADIOLOGY  I personally reviewed all radiographic images ordered to evaluate for the above acute complaints and reviewed radiology reports and findings.  These findings were personally discussed with the patient.  Please see medical record for radiology report.  ____________________________________________   PROCEDURES  Procedure(s) performed: none    Critical Care performed:  no ____________________________________________   INITIAL IMPRESSION / ASSESSMENT AND PLAN / ED COURSE  Pertinent labs & imaging results that were available during my care of the patient were reviewed by me and considered in my medical decision making (see chart for details).  DDX: Dehydration, ACS, electrolyte abnormality, anemia, dysrhythmia,   Steven MYRON Sr. is a 81 y.o. who presents to the ED with complaint of weakness and fatigue. Patient is on multiple medications with sedating effects. No classic findings or symptoms to suggest ACS the patient does have risk factors.  Does appear mildly dehydrated.  Will give IVF for dehydration.  The patient will be placed on continuous pulse oximetry and telemetry for monitoring.  Laboratory evaluation will be sent to evaluate for the above complaints.     Clinical Course  Comment By Time  Patient reassessed and states he feels much improved. No weakness at this time. Denies any chest pain or pressure. Merlyn Lot, MD 10/04 1442  UA negative. Will Repeat troponin. Merlyn Lot, MD 10/04 1510  Patient reassessed and feels much better. Denies any chest pain or shortness of breath. His repeat troponin is reassuring. Discussed other possibilities including dehydration as well as Xanax as cause of his symptoms. Less consistent with TIAs he had no structural neurodeficits. Discussed follow-up with cardiology and primary care physician regarding further medical recommendations.  Have discussed with the patient and available family all diagnostics and treatments performed thus far and all questions were answered to the best of my ability. The patient demonstrates understanding and agreement with plan.  Merlyn Lot, MD 10/04 1643     ____________________________________________   FINAL CLINICAL IMPRESSION(S) / ED DIAGNOSES  Final diagnoses:  Fatigue, unspecified type  Weakness  Dehydration      NEW MEDICATIONS STARTED DURING  THIS VISIT:  New Prescriptions   No medications on file     Note:  This document was prepared using Dragon voice recognition software and may include unintentional dictation errors.  Merlyn Lot, MD 03/09/16 [phone]

## 2016-03-09 NOTE — ED Triage Notes (Signed)
Pt reports having increased fatigue and weakness since yesterday. Reports he was working at [employer] when he had a near syncopal episode. Pt pale and diaphoretic in triage. Reports having blurred vision since this morning. Reports some trouble taking deep breaths. Denies chest pain.. Pt reports that he started taking Xanax and Prozacfor approximately 8 days ago for new dx of anxiety. Hx stents placed in 2001 and reports having the same feeling then.

## 2016-04-04 ENCOUNTER — Other Ambulatory Visit: Payer: Self-pay | Admitting: Internal Medicine

## 2016-04-04 DIAGNOSIS — H539 Unspecified visual disturbance: Secondary | ICD-10-CM

## 2016-04-15 ENCOUNTER — Ambulatory Visit
Admission: RE | Admit: 2016-04-15 | Discharge: 2016-04-15 | Disposition: A | Discharge status: Home / Self Care | Payer: Medicare Other | Source: Ambulatory Visit | Attending: Internal Medicine | Admitting: Internal Medicine

## 2016-04-15 DIAGNOSIS — G3189 Other specified degenerative diseases of nervous system: Secondary | ICD-10-CM | POA: Diagnosis not present

## 2016-04-15 DIAGNOSIS — H539 Unspecified visual disturbance: Secondary | ICD-10-CM | POA: Diagnosis present

## 2016-04-15 DIAGNOSIS — H532 Diplopia: Secondary | ICD-10-CM | POA: Diagnosis not present

## 2017-02-28 ENCOUNTER — Other Ambulatory Visit: Payer: Self-pay | Admitting: Internal Medicine

## 2017-02-28 DIAGNOSIS — I2510 Atherosclerotic heart disease of native coronary artery without angina pectoris: Secondary | ICD-10-CM

## 2017-03-02 ENCOUNTER — Ambulatory Visit
Admission: RE | Admit: 2017-03-02 | Discharge: 2017-03-02 | Disposition: A | Discharge status: Home / Self Care | Payer: Medicare Other | Source: Ambulatory Visit | Attending: Internal Medicine | Admitting: Internal Medicine

## 2017-03-02 DIAGNOSIS — E7800 Pure hypercholesterolemia, unspecified: Secondary | ICD-10-CM | POA: Insufficient documentation

## 2017-03-02 DIAGNOSIS — I2510 Atherosclerotic heart disease of native coronary artery without angina pectoris: Secondary | ICD-10-CM

## 2017-03-02 DIAGNOSIS — I10 Essential (primary) hypertension: Secondary | ICD-10-CM | POA: Insufficient documentation

## 2017-03-02 DIAGNOSIS — I6523 Occlusion and stenosis of bilateral carotid arteries: Secondary | ICD-10-CM | POA: Diagnosis not present

## 2017-03-02 DIAGNOSIS — R42 Dizziness and giddiness: Secondary | ICD-10-CM | POA: Insufficient documentation

## 2017-03-06 DIAGNOSIS — I779 Disorder of arteries and arterioles, unspecified: Secondary | ICD-10-CM | POA: Insufficient documentation

## 2017-03-06 DIAGNOSIS — I739 Peripheral vascular disease, unspecified: Secondary | ICD-10-CM

## 2017-06-16 DIAGNOSIS — M1711 Unilateral primary osteoarthritis, right knee: Secondary | ICD-10-CM | POA: Insufficient documentation

## 2017-07-11 ENCOUNTER — Emergency Department
Admission: EM | Admit: 2017-07-11 | Discharge: 2017-07-11 | Disposition: A | Discharge status: Home / Self Care | Payer: Medicare Other | Attending: Emergency Medicine | Admitting: Emergency Medicine

## 2017-07-11 DIAGNOSIS — Z79899 Other long term (current) drug therapy: Secondary | ICD-10-CM | POA: Diagnosis not present

## 2017-07-11 DIAGNOSIS — Z87891 Personal history of nicotine dependence: Secondary | ICD-10-CM | POA: Insufficient documentation

## 2017-07-11 DIAGNOSIS — E860 Dehydration: Secondary | ICD-10-CM | POA: Diagnosis not present

## 2017-07-11 DIAGNOSIS — N390 Urinary tract infection, site not specified: Secondary | ICD-10-CM | POA: Diagnosis not present

## 2017-07-11 DIAGNOSIS — R319 Hematuria, unspecified: Secondary | ICD-10-CM

## 2017-07-11 DIAGNOSIS — R3915 Urgency of urination: Secondary | ICD-10-CM | POA: Diagnosis present

## 2017-07-11 DIAGNOSIS — Z7982 Long term (current) use of aspirin: Secondary | ICD-10-CM | POA: Insufficient documentation

## 2017-07-11 LAB — BASIC METABOLIC PANEL
ANION GAP: 7 (ref 5–15)
BUN: 19 mg/dL (ref 6–20)
CALCIUM: 9 mg/dL (ref 8.9–10.3)
CHLORIDE: 98 mmol/L — AB (ref 101–111)
CO2: 28 mmol/L (ref 22–32)
CREATININE: 1.34 mg/dL — AB (ref 0.61–1.24)
GFR calc Af Amer: 56 mL/min — ABNORMAL LOW (ref >60)
GFR calc non Af Amer: 48 mL/min — ABNORMAL LOW (ref >60)
Glucose, Bld: 96 mg/dL (ref 65–99)
Potassium: 5.1 mmol/L (ref 3.5–5.1)
Sodium: 133 mmol/L — ABNORMAL LOW (ref 135–145)

## 2017-07-11 LAB — CBC WITH DIFFERENTIAL/PLATELET
Basophils Absolute: 0 10*3/uL (ref 0–0.1)
Basophils Relative: 0 %
Eosinophils Absolute: 0.1 10*3/uL (ref 0–0.7)
Eosinophils Relative: 1 %
HEMATOCRIT: 43.4 % (ref 40.0–52.0)
HEMOGLOBIN: 14.7 g/dL (ref 13.0–18.0)
Lymphocytes Relative: 10 %
Lymphs Abs: 1.1 10*3/uL (ref 1.0–3.6)
MCH: 34.5 pg — ABNORMAL HIGH (ref 26.0–34.0)
MCHC: 33.8 g/dL (ref 32.0–36.0)
MCV: 102.2 fL — AB (ref 80.0–100.0)
MONO ABS: 1.1 10*3/uL — AB (ref 0.2–1.0)
MONOS PCT: 10 %
NEUTROS ABS: 8.3 10*3/uL — AB (ref 1.4–6.5)
Neutrophils Relative %: 79 %
Platelets: 197 10*3/uL (ref 150–440)
RBC: 4.25 MIL/uL — ABNORMAL LOW (ref 4.40–5.90)
RDW: 13 % (ref 11.5–14.5)
WBC: 10.5 10*3/uL (ref 3.8–10.6)

## 2017-07-11 LAB — URINALYSIS, COMPLETE (UACMP) WITH MICROSCOPIC
Bacteria, UA: NONE SEEN
Bilirubin Urine: NEGATIVE
GLUCOSE, UA: NEGATIVE mg/dL
Ketones, ur: NEGATIVE mg/dL
Nitrite: NEGATIVE
PH: 7 (ref 5.0–8.0)
Protein, ur: >=300 mg/dL — AB
SPECIFIC GRAVITY, URINE: 1.022 (ref 1.005–1.030)
SQUAMOUS EPITHELIAL / LPF: NONE SEEN

## 2017-07-11 MED ORDER — DEXTROSE 5 % IV SOLN
1.0000 g | Freq: Once | INTRAVENOUS | Status: AC
  Administered 2017-07-11: 1 g via INTRAVENOUS
  Filled 2017-07-11: qty 10

## 2017-07-11 MED ORDER — CEPHALEXIN 500 MG PO CAPS: 500.0000 mg | ORAL_CAPSULE | Freq: Three times a day (TID) | ORAL | 0 refills | Status: DC

## 2017-07-11 MED ORDER — SODIUM CHLORIDE 0.9 % IV BOLUS (SEPSIS)
500.0000 mL | Freq: Once | INTRAVENOUS | Status: AC
  Administered 2017-07-11: 500 mL via INTRAVENOUS

## 2017-07-11 NOTE — ED Notes (Signed)
ED Provider at bedside. 

## 2017-07-11 NOTE — Discharge Instructions (Signed)
Please seek medical attention for any high fevers, chest pain, shortness of breath, change in behavior, persistent vomiting, bloody stool or any other new or concerning symptoms.  

## 2017-07-11 NOTE — ED Provider Notes (Signed)
[facility] Emergency Department Provider Note  ____________________________________________   I have reviewed the triage vital signs and the nursing notes.   HISTORY  Chief Complaint Urinary urgency  History limited by: Not Limited   HPI Steven Ganas [facility] Sr. is a 83 y.o. male who presents to the emergency department today because of concern for urinary urgency. The patient states that the symptoms started this morning. Felt the need to urinate throughout the day, however when he does try to urinate only a little comes out. He noticed associated change in the color of his urine. It was dark orange this morning and has become more red throughout the day. He denies any significant pain in the lower abdomen. Denies similar symptoms in the past. No fevers.    Per medical record review patient has a history of prostate disorder.  Past Medical History:  Diagnosis Date  . Anxiety   . HBP (high blood pressure)   . Heart trouble   . High cholesterol   . Prostate disorder     Patient Active Problem List   Diagnosis Date Noted  . Neuroma 03/20/2013    Past Surgical History:  Procedure Laterality Date  . COLONOSCOPY    . HEART STENTS    . HERNIA REPAIR     X 4  . LEFT HAND  1961    Prior to Admission medications   Medication Sig Start Date End Date Taking? Authorizing Provider  aspirin 81 MG tablet Take 81 mg by mouth daily.    [provider]  atorvastatin (LIPITOR) 10 MG tablet Take 10 mg by mouth daily.    [provider]  azithromycin (ZITHROMAX Z-PAK) 250 MG tablet Use as directed for 5 days. 06/06/15   [name], MD  Calcium Citrate (CITRACAL PO) Take by mouth 2 (two) times daily.    [provider]  Calcium-Vitamin D (CALTRATE 600 PLUS-VIT D PO) Take by mouth 2 (two) times daily.    [provider]  captopril (CAPOTEN) 25 MG tablet Take 25 mg by mouth 2 (two) times daily.    [provider]   hydrochlorothiazide (MICROZIDE) 12.5 MG capsule Take 25 mg by mouth daily.     [provider]  ibuprofen (ADVIL,MOTRIN) 600 MG tablet Take 1 tablet (600 mg total) by mouth every 8 (eight) hours as needed. 07/08/13   Hyatt, Max T, DPM  Lansoprazole (PREVACID PO) Take by mouth. TAKE ONE TABLET ONCE WEEKLY    [provider]  Multiple Vitamins-Minerals (CENTRUM SILVER PO) Take by mouth daily.    [provider]  nitroGLYCERIN (NITROSTAT) 0.3 MG SL tablet Place 0.3 mg under the tongue as needed for chest pain.    [provider]  sildenafil (VIAGRA) 100 MG tablet Take 100 mg by mouth as needed for erectile dysfunction.    [provider]  tamsulosin (FLOMAX) 0.4 MG CAPS capsule Take by mouth daily.    [provider]  Verapamil HCl CR 200 MG CP24 Take by mouth daily.    [provider]    Allergies Patient has no known allergies.  No family history on file.  Social History Social History   Tobacco Use  . Smoking status: Former Smoker    Types: Cigars    Last attempt to quit: 12/13/2012    Years since quitting: 4.5  . Smokeless tobacco: Former Systems developer    Quit date: 03/20/1988  Substance Use Topics  . Alcohol use: Yes    Comment:  OCCASIONALLY  . Drug use: No    Review of Systems Constitutional: No fever/chills Eyes: No visual changes. ENT: No sore throat. Cardiovascular: Denies chest pain. Respiratory: Denies shortness of breath. Gastrointestinal: No abdominal pain.  No nausea, no vomiting.  No diarrhea.   Genitourinary: Positive for urinary urgency. Musculoskeletal: Positive for right lower back pain. Skin: Negative for rash. Neurological: Negative for headaches, focal weakness or numbness.  ____________________________________________   PHYSICAL EXAM:  VITAL SIGNS: ED Triage Vitals  Enc Vitals Group     BP 07/11/17 1424 (!) 141/82     Pulse Rate 07/11/17 1424 82     Resp 07/11/17 1424 18     Temp 07/11/17  1424 98.2 F (36.8 C)     Temp Source 07/11/17 1424 Oral     SpO2 07/11/17 1424 96 %     Weight 07/11/17 1412 180 lb (81.6 kg)     Height --      Head Circumference --      Peak Flow --      Pain Score 07/11/17 1411 5    Constitutional: Alert and oriented. Well appearing and in no distress. Eyes: Conjunctivae are normal.  ENT   Head: Normocephalic and atraumatic.   Nose: No congestion/rhinnorhea.   Mouth/Throat: Mucous membranes are moist.   Neck: No stridor. Hematological/Lymphatic/Immunilogical: No cervical lymphadenopathy. Cardiovascular: Normal rate, regular rhythm.  No murmurs, rubs, or gallops.  Respiratory: Normal respiratory effort without tachypnea nor retractions. Breath sounds are clear and equal bilaterally. No wheezes/rales/rhonchi. Gastrointestinal: Soft and non tender. No rebound. No guarding.  Genitourinary: Deferred Musculoskeletal: Normal range of motion in all extremities. No lower extremity edema. Neurologic:  Normal speech and language. No gross focal neurologic deficits are appreciated.  Skin:  Skin is warm, dry and intact. No rash noted. Psychiatric: Mood and affect are normal. Speech and behavior are normal. Patient exhibits appropriate insight and judgment.  ____________________________________________    LABS (pertinent positives/negatives)  UA Large urine, nitrite negative, leukocytes moderate, too numerous to count RBC, WBC  ____________________________________________   EKG  None  ____________________________________________    RADIOLOGY  None  ____________________________________________   PROCEDURES  Procedures  ____________________________________________   INITIAL IMPRESSION / ASSESSMENT AND PLAN / ED COURSE  Pertinent labs & imaging results that were available during my care of the patient were reviewed by me and considered in my medical decision making (see chart for details).  Presented to the emergency  department today because of concerns for urinary urgency.  Patient also had noticed some blood in his urine.  Bladder scan here only revealed 18 mL's.  Patient appeared somewhat dehydrated on blood work but no signs of any significant kidney damage.  Urine does show too numerous to count RBCs and white blood cells.  I do wonder if patient has an underlying infection.  This could explain the urgency as well as the hematuria.  Will plan on treating patient for infection.  Will give dose of IV antibiotics here in the emergency department.  Discussed findings and plan with patient and family.   ____________________________________________   FINAL CLINICAL IMPRESSION(S) / ED DIAGNOSES  Final diagnoses:  Hematuria, unspecified type  Dehydration  Lower urinary tract infectious disease     Note: This dictation was prepared with Dragon dictation. Any transcriptional errors that result from this process are unintentional     Nance Pear, MD 07/11/17 1850

## 2017-07-11 NOTE — ED Notes (Signed)
Pt signed esignature.  D/c inst to pt.  Iv dc'ed.   

## 2017-07-11 NOTE — ED Triage Notes (Signed)
Pt reports that he has been having urge to pee, but then is unable to completely void.  Pt brought in sample of urine with him that has was appears to be blood in it.  Pt is A&Ox4, in NAD.

## 2017-07-11 NOTE — ED Notes (Signed)
Resumed care from samantha rn    Pt reports urinary urgency and pain with urination.  Pt alert.  Family with pt.

## 2017-07-13 LAB — URINE CULTURE

## 2017-07-14 NOTE — Progress Notes (Signed)
ED CULTURE REPORT   83 yo male presented to ED on 2/5 with complaint of urinary urgency and UA showed WBC and Hgb in urine. A urine culture was obtained and discharged with cephalexin 500mg  TID for 10 days. The urine culture resulted 2/8 showing E coli sensitive to cefazolin. I spoke with ED MD Dr. Cinda Quest who agreed that no further intervention was needed.   Results for orders placed or performed during the hospital encounter of 07/11/17  Urine Culture     Status: Abnormal   Collection Time: 07/11/17  2:15 PM  Result Value Ref Range Status   Specimen Description   Final    URINE, RANDOM Performed at [facility], [address] [place], [place] [address]    Special Requests   Final    NONE Performed at [facility], [address] [place], [place] [address]    Culture >=100,000 COLONIES/mL ESCHERICHIA COLI (A)  Final   Report Status 07/13/2017 FINAL  Final   Organism ID, Bacteria ESCHERICHIA COLI (A)  Final      Susceptibility   Escherichia coli - MIC*    AMPICILLIN <=2 SENSITIVE Sensitive     CEFAZOLIN <=4 SENSITIVE Sensitive     CEFTRIAXONE <=1 SENSITIVE Sensitive     CIPROFLOXACIN <=0.25 SENSITIVE Sensitive     GENTAMICIN <=1 SENSITIVE Sensitive     IMIPENEM <=0.25 SENSITIVE Sensitive     NITROFURANTOIN <=16 SENSITIVE Sensitive     TRIMETH/SULFA <=20 SENSITIVE Sensitive     AMPICILLIN/SULBACTAM <=2 SENSITIVE Sensitive     PIP/TAZO <=4 SENSITIVE Sensitive     Extended ESBL NEGATIVE Sensitive     * >=100,000 COLONIES/mL ESCHERICHIA COLI   Lendon Ka, PharmD Pharmacy Resident

## 2017-09-13 ENCOUNTER — Other Ambulatory Visit: Payer: Self-pay

## 2017-09-13 ENCOUNTER — Encounter
Admission: RE | Admit: 2017-09-13 | Discharge: 2017-09-13 | Disposition: A | Discharge status: Home / Self Care | Payer: Medicare Other | Source: Ambulatory Visit | Attending: Orthopedic Surgery | Admitting: Orthopedic Surgery

## 2017-09-13 DIAGNOSIS — Z0183 Encounter for blood typing: Secondary | ICD-10-CM | POA: Diagnosis not present

## 2017-09-13 DIAGNOSIS — Z01818 Encounter for other preprocedural examination: Secondary | ICD-10-CM | POA: Diagnosis present

## 2017-09-13 DIAGNOSIS — Z01810 Encounter for preprocedural cardiovascular examination: Secondary | ICD-10-CM | POA: Diagnosis not present

## 2017-09-13 DIAGNOSIS — I10 Essential (primary) hypertension: Secondary | ICD-10-CM | POA: Insufficient documentation

## 2017-09-13 DIAGNOSIS — Z01812 Encounter for preprocedural laboratory examination: Secondary | ICD-10-CM | POA: Diagnosis not present

## 2017-09-13 DIAGNOSIS — I2510 Atherosclerotic heart disease of native coronary artery without angina pectoris: Secondary | ICD-10-CM | POA: Diagnosis not present

## 2017-09-13 HISTORY — DX: Unilateral inguinal hernia, without obstruction or gangrene, not specified as recurrent: K40.90

## 2017-09-13 HISTORY — DX: Diverticulosis of intestine, part unspecified, without perforation or abscess without bleeding: K57.90

## 2017-09-13 HISTORY — DX: Inflammatory liver disease, unspecified: K75.9

## 2017-09-13 HISTORY — DX: Depression, unspecified: F32.A

## 2017-09-13 HISTORY — DX: Polyp of colon: K63.5

## 2017-09-13 HISTORY — DX: Polyneuropathy, unspecified: G62.9

## 2017-09-13 HISTORY — DX: Benign prostatic hyperplasia without lower urinary tract symptoms: N40.0

## 2017-09-13 HISTORY — DX: Unspecified viral hepatitis B without hepatic coma: B19.10

## 2017-09-13 HISTORY — DX: Gastro-esophageal reflux disease without esophagitis: K21.9

## 2017-09-13 HISTORY — DX: Atherosclerotic heart disease of native coronary artery without angina pectoris: I25.10

## 2017-09-13 HISTORY — DX: Major depressive disorder, single episode, unspecified: F32.9

## 2017-09-13 HISTORY — DX: Unspecified osteoarthritis, unspecified site: M19.90

## 2017-09-13 LAB — URINALYSIS, ROUTINE W REFLEX MICROSCOPIC
BILIRUBIN URINE: NEGATIVE
GLUCOSE, UA: NEGATIVE mg/dL
HGB URINE DIPSTICK: NEGATIVE
KETONES UR: 5 mg/dL — AB
Leukocytes, UA: NEGATIVE
NITRITE: NEGATIVE
Protein, ur: NEGATIVE mg/dL
SPECIFIC GRAVITY, URINE: 1.004 — AB (ref 1.005–1.030)
pH: 6 (ref 5.0–8.0)

## 2017-09-13 LAB — TYPE AND SCREEN
ABO/RH(D): AB POS
ANTIBODY SCREEN: NEGATIVE

## 2017-09-13 LAB — SURGICAL PCR SCREEN
MRSA, PCR: NEGATIVE
Staphylococcus aureus: POSITIVE — AB

## 2017-09-13 LAB — CBC
HCT: 41.4 % (ref 40.0–52.0)
Hemoglobin: 13.8 g/dL (ref 13.0–18.0)
MCH: 34 pg (ref 26.0–34.0)
MCHC: 33.2 g/dL (ref 32.0–36.0)
MCV: 102.3 fL — AB (ref 80.0–100.0)
PLATELETS: 199 10*3/uL (ref 150–440)
RBC: 4.05 MIL/uL — ABNORMAL LOW (ref 4.40–5.90)
RDW: 13.4 % (ref 11.5–14.5)
WBC: 4.6 10*3/uL (ref 3.8–10.6)

## 2017-09-13 LAB — COMPREHENSIVE METABOLIC PANEL
ALBUMIN: 4.2 g/dL (ref 3.5–5.0)
ALT: 19 U/L (ref 17–63)
ANION GAP: 8 (ref 5–15)
AST: 23 U/L (ref 15–41)
Alkaline Phosphatase: 79 U/L (ref 38–126)
BUN: 11 mg/dL (ref 6–20)
CHLORIDE: 96 mmol/L — AB (ref 101–111)
CO2: 29 mmol/L (ref 22–32)
Calcium: 9.1 mg/dL (ref 8.9–10.3)
Creatinine, Ser: 0.66 mg/dL (ref 0.61–1.24)
GFR calc Af Amer: >60 mL/min (ref >60)
Glucose, Bld: 84 mg/dL (ref 65–99)
POTASSIUM: 4.2 mmol/L (ref 3.5–5.1)
Sodium: 133 mmol/L — ABNORMAL LOW (ref 135–145)
TOTAL PROTEIN: 6.8 g/dL (ref 6.5–8.1)
Total Bilirubin: 0.9 mg/dL (ref 0.3–1.2)

## 2017-09-13 LAB — PROTIME-INR
INR: 0.95
PROTHROMBIN TIME: 12.6 s (ref 11.4–15.2)

## 2017-09-13 LAB — APTT: APTT: 31 s (ref 24–36)

## 2017-09-13 LAB — C-REACTIVE PROTEIN: CRP: <0.8 mg/dL (ref <1.0)

## 2017-09-13 LAB — SEDIMENTATION RATE: SED RATE: 4 mm/h (ref 0–20)

## 2017-09-13 NOTE — Patient Instructions (Signed)
Your procedure is scheduled on: Monday, September 25, 2017 Report to Day Surgery on the 2nd floor of the [facility]. To find out your arrival time, please call [phone] between 1PM - 3PM on: Friday, September 22, 2017  REMEMBER: Instructions that are not followed completely may result in serious medical risk, up to and including death; or upon the discretion of your surgeon and anesthesiologist your surgery may need to be rescheduled.  Do not eat food after midnight the night before your procedure.  No gum chewing, lozengers or hard candies.  You may however, drink CLEAR liquids up to 2 hours before you are scheduled to arrive for your surgery. Do not drink anything within 2 hours of the start of your surgery.  Clear liquids include: - water  - apple juice without pulp - clear gatorade - black coffee or tea (Do NOT add anything to the coffee or tea) Do NOT drink anything that is not on this list.  No Alcohol for 24 hours before or after surgery.  No Smoking including e-cigarettes for 24 hours prior to surgery.  No chewable tobacco products for at least 6 hours prior to surgery.  No nicotine patches on the day of surgery.  On the morning of surgery brush your teeth with toothpaste and water, you may rinse your mouth with mouthwash if you wish. Do not swallow any toothpaste or mouthwash.  Notify your doctor if there is any change in your medical condition (cold, fever, infection).  Do not wear jewelry, make-up, hairpins, clips or nail polish.  Do not wear lotions, powders, or perfumes. You may wear deodorant.  Do not shave 48 hours prior to surgery. Men may shave face and neck.  Contacts and dentures may not be worn into surgery.  Do not bring valuables to the hospital, including drivers license, insurance or credit cards.  [facility] is not responsible for any belongings or valuables.   TAKE THESE MEDICATIONS THE MORNING OF SURGERY:  1.  Zoloft  Use CHG Soap as directed  on instruction sheet.  Follow recommendations from Cardiologist regarding stopping Aspirin.  On April 15 - Stop Anti-inflammatories (NSAIDS) such as Advil, Aleve, Ibuprofen, Motrin, Naproxen, Naprosyn and Aspirin based products such as Excedrin, Goodys Powder, BC Powder. (May take Tylenol or Acetaminophen if needed.)  On April 15 - Stop ANY OVER THE COUNTER supplements until after surgery. (May continue multivitamin.)  Wear comfortable clothing (specific to your surgery type) to the hospital.  Plan for stool softeners for home use.  If you are being admitted to the hospital overnight, leave your suitcase in the car. After surgery it may be brought to your room.  Please call [phone] if you have any questions about these instructions.

## 2017-09-14 NOTE — Pre-Procedure Instructions (Signed)
FAXED T STAPH TO DR Marry Guan

## 2017-09-15 LAB — URINE CULTURE
Culture: <10000 — AB
Special Requests: NORMAL

## 2017-09-24 MED ORDER — SODIUM CHLORIDE 0.9 % IV SOLN
1000.0000 mg | INTRAVENOUS | Status: AC
  Administered 2017-09-25: 1000 mg via INTRAVENOUS
  Filled 2017-09-24: qty 10

## 2017-09-24 MED ORDER — CEFAZOLIN SODIUM-DEXTROSE 2-4 GM/100ML-% IV SOLN
2.0000 g | INTRAVENOUS | Status: AC
  Filled 2017-09-24: qty 100

## 2017-09-25 ENCOUNTER — Inpatient Hospital Stay: Payer: Medicare Other

## 2017-09-25 ENCOUNTER — Encounter
Admission: RE | Disposition: A | Discharge status: Home Health | Payer: Self-pay | Source: Ambulatory Visit | Attending: Orthopedic Surgery

## 2017-09-25 ENCOUNTER — Encounter: Payer: Self-pay | Admitting: Orthopedic Surgery

## 2017-09-25 ENCOUNTER — Inpatient Hospital Stay: Payer: Medicare Other | Admitting: Anesthesiology

## 2017-09-25 ENCOUNTER — Inpatient Hospital Stay
Admission: RE | Admit: 2017-09-25 | Discharge: 2017-09-28 | DRG: 470 | Disposition: A | Discharge status: Home Health | Payer: Medicare Other | Source: Ambulatory Visit | Attending: Orthopedic Surgery | Admitting: Orthopedic Surgery

## 2017-09-25 ENCOUNTER — Other Ambulatory Visit: Payer: Self-pay

## 2017-09-25 DIAGNOSIS — I2510 Atherosclerotic heart disease of native coronary artery without angina pectoris: Secondary | ICD-10-CM | POA: Diagnosis present

## 2017-09-25 DIAGNOSIS — M25761 Osteophyte, right knee: Secondary | ICD-10-CM | POA: Diagnosis present

## 2017-09-25 DIAGNOSIS — E7800 Pure hypercholesterolemia, unspecified: Secondary | ICD-10-CM | POA: Diagnosis present

## 2017-09-25 DIAGNOSIS — Z833 Family history of diabetes mellitus: Secondary | ICD-10-CM

## 2017-09-25 DIAGNOSIS — Z86010 Personal history of colonic polyps: Secondary | ICD-10-CM | POA: Diagnosis not present

## 2017-09-25 DIAGNOSIS — Z8249 Family history of ischemic heart disease and other diseases of the circulatory system: Secondary | ICD-10-CM

## 2017-09-25 DIAGNOSIS — I10 Essential (primary) hypertension: Secondary | ICD-10-CM | POA: Diagnosis present

## 2017-09-25 DIAGNOSIS — Z888 Allergy status to other drugs, medicaments and biological substances status: Secondary | ICD-10-CM

## 2017-09-25 DIAGNOSIS — Z825 Family history of asthma and other chronic lower respiratory diseases: Secondary | ICD-10-CM

## 2017-09-25 DIAGNOSIS — Z96659 Presence of unspecified artificial knee joint: Secondary | ICD-10-CM

## 2017-09-25 DIAGNOSIS — F329 Major depressive disorder, single episode, unspecified: Secondary | ICD-10-CM | POA: Diagnosis present

## 2017-09-25 DIAGNOSIS — Z9089 Acquired absence of other organs: Secondary | ICD-10-CM

## 2017-09-25 DIAGNOSIS — H902 Conductive hearing loss, unspecified: Secondary | ICD-10-CM | POA: Diagnosis present

## 2017-09-25 DIAGNOSIS — G629 Polyneuropathy, unspecified: Secondary | ICD-10-CM | POA: Diagnosis present

## 2017-09-25 DIAGNOSIS — F419 Anxiety disorder, unspecified: Secondary | ICD-10-CM | POA: Insufficient documentation

## 2017-09-25 DIAGNOSIS — M1711 Unilateral primary osteoarthritis, right knee: Principal | ICD-10-CM | POA: Diagnosis present

## 2017-09-25 DIAGNOSIS — Z803 Family history of malignant neoplasm of breast: Secondary | ICD-10-CM

## 2017-09-25 DIAGNOSIS — Z7982 Long term (current) use of aspirin: Secondary | ICD-10-CM

## 2017-09-25 DIAGNOSIS — Z79899 Other long term (current) drug therapy: Secondary | ICD-10-CM

## 2017-09-25 DIAGNOSIS — Z801 Family history of malignant neoplasm of trachea, bronchus and lung: Secondary | ICD-10-CM | POA: Diagnosis not present

## 2017-09-25 DIAGNOSIS — Z955 Presence of coronary angioplasty implant and graft: Secondary | ICD-10-CM

## 2017-09-25 DIAGNOSIS — N400 Enlarged prostate without lower urinary tract symptoms: Secondary | ICD-10-CM | POA: Diagnosis present

## 2017-09-25 DIAGNOSIS — K219 Gastro-esophageal reflux disease without esophagitis: Secondary | ICD-10-CM | POA: Diagnosis present

## 2017-09-25 HISTORY — PX: KNEE ARTHROPLASTY: SHX992

## 2017-09-25 LAB — ABO/RH: ABO/RH(D): AB POS

## 2017-09-25 SURGERY — ARTHROPLASTY, KNEE, TOTAL, USING IMAGELESS COMPUTER-ASSISTED NAVIGATION
Anesthesia: Spinal | Site: Knee | Laterality: Right | Wound class: Clean

## 2017-09-25 MED ORDER — PROPOFOL 500 MG/50ML IV EMUL
INTRAVENOUS | Status: DC | PRN
  Administered 2017-09-25: 40 ug/kg/min via INTRAVENOUS

## 2017-09-25 MED ORDER — LACTATED RINGERS IV SOLN
INTRAVENOUS | Status: DC
  Administered 2017-09-25 (×2): via INTRAVENOUS

## 2017-09-25 MED ORDER — SODIUM CHLORIDE 0.9 % IV SOLN
INTRAVENOUS | Status: DC | PRN
  Administered 2017-09-25: 30 ug/min via INTRAVENOUS

## 2017-09-25 MED ORDER — BUPIVACAINE HCL (PF) 0.25 % IJ SOLN
INTRAMUSCULAR | Status: DC | PRN
  Administered 2017-09-25: 60 mL

## 2017-09-25 MED ORDER — FAMOTIDINE 20 MG PO TABS
20.0000 mg | ORAL_TABLET | Freq: Once | ORAL | Status: AC
  Administered 2017-09-25: 20 mg via ORAL

## 2017-09-25 MED ORDER — ACETAMINOPHEN 10 MG/ML IV SOLN
1000.0000 mg | Freq: Four times a day (QID) | INTRAVENOUS | Status: AC
  Administered 2017-09-25 – 2017-09-26 (×3): 1000 mg via INTRAVENOUS
  Filled 2017-09-25 (×5): qty 100

## 2017-09-25 MED ORDER — FENTANYL CITRATE (PF) 100 MCG/2ML IJ SOLN: 25.0000 ug | INTRAMUSCULAR | Status: DC | PRN

## 2017-09-25 MED ORDER — PHENOL 1.4 % MT LIQD
1.0000 | OROMUCOSAL | Status: DC | PRN
  Filled 2017-09-25: qty 177

## 2017-09-25 MED ORDER — CELECOXIB 200 MG PO CAPS
ORAL_CAPSULE | ORAL | Status: AC
  Administered 2017-09-25: 400 mg via ORAL
  Filled 2017-09-25: qty 2

## 2017-09-25 MED ORDER — MIDAZOLAM HCL 5 MG/5ML IJ SOLN
INTRAMUSCULAR | Status: DC | PRN
  Administered 2017-09-25: 1 mg via INTRAVENOUS

## 2017-09-25 MED ORDER — GABAPENTIN 300 MG PO CAPS
ORAL_CAPSULE | ORAL | Status: AC
  Filled 2017-09-25: qty 1

## 2017-09-25 MED ORDER — ONDANSETRON HCL 4 MG/2ML IJ SOLN: 4.0000 mg | Freq: Once | INTRAMUSCULAR | Status: DC | PRN

## 2017-09-25 MED ORDER — FENTANYL CITRATE (PF) 100 MCG/2ML IJ SOLN
INTRAMUSCULAR | Status: DC | PRN
  Administered 2017-09-25: 100 ug via INTRAVENOUS

## 2017-09-25 MED ORDER — CEFAZOLIN SODIUM-DEXTROSE 2-4 GM/100ML-% IV SOLN
2.0000 g | Freq: Four times a day (QID) | INTRAVENOUS | Status: AC
  Administered 2017-09-25 – 2017-09-26 (×4): 2 g via INTRAVENOUS
  Filled 2017-09-25 (×4): qty 100

## 2017-09-25 MED ORDER — PROPOFOL 500 MG/50ML IV EMUL
INTRAVENOUS | Status: AC
Start: 2017-09-25 — End: ?
  Filled 2017-09-25: qty 50

## 2017-09-25 MED ORDER — BISACODYL 10 MG RE SUPP
10.0000 mg | Freq: Every day | RECTAL | Status: DC | PRN
  Administered 2017-09-27: 10 mg via RECTAL
  Filled 2017-09-25: qty 1

## 2017-09-25 MED ORDER — ACETAMINOPHEN 10 MG/ML IV SOLN
INTRAVENOUS | Status: DC | PRN
  Administered 2017-09-25: 1000 mg via INTRAVENOUS

## 2017-09-25 MED ORDER — TRANEXAMIC ACID 1000 MG/10ML IV SOLN
1000.0000 mg | Freq: Once | INTRAVENOUS | Status: AC
  Administered 2017-09-25: 1000 mg via INTRAVENOUS
  Filled 2017-09-25: qty 10

## 2017-09-25 MED ORDER — SODIUM CHLORIDE 0.9 % IV SOLN
INTRAVENOUS | Status: DC | PRN
  Administered 2017-09-25: 60 mL

## 2017-09-25 MED ORDER — NITROGLYCERIN 0.4 MG SL SUBL
0.4000 mg | SUBLINGUAL_TABLET | SUBLINGUAL | Status: DC | PRN
Start: 2017-09-25 — End: 2017-09-28

## 2017-09-25 MED ORDER — DEXAMETHASONE SODIUM PHOSPHATE 10 MG/ML IJ SOLN
INTRAMUSCULAR | Status: AC
  Filled 2017-09-25: qty 1

## 2017-09-25 MED ORDER — PROPOFOL 10 MG/ML IV BOLUS
INTRAVENOUS | Status: DC | PRN
  Administered 2017-09-25: 30 mg via INTRAVENOUS
  Administered 2017-09-25 (×2): 15 mg via INTRAVENOUS

## 2017-09-25 MED ORDER — ONDANSETRON HCL 4 MG/2ML IJ SOLN: 4.0000 mg | Freq: Four times a day (QID) | INTRAMUSCULAR | Status: DC | PRN

## 2017-09-25 MED ORDER — FAMOTIDINE 20 MG PO TABS
ORAL_TABLET | ORAL | Status: AC
  Filled 2017-09-25: qty 1

## 2017-09-25 MED ORDER — LIDOCAINE HCL (PF) 2 % IJ SOLN
INTRAMUSCULAR | Status: AC
  Filled 2017-09-25: qty 10

## 2017-09-25 MED ORDER — SENNOSIDES-DOCUSATE SODIUM 8.6-50 MG PO TABS
1.0000 | ORAL_TABLET | Freq: Two times a day (BID) | ORAL | Status: DC
  Administered 2017-09-25 – 2017-09-27 (×6): 1 via ORAL
  Filled 2017-09-25 (×6): qty 1

## 2017-09-25 MED ORDER — MIDAZOLAM HCL 2 MG/2ML IJ SOLN
INTRAMUSCULAR | Status: AC
  Filled 2017-09-25: qty 2

## 2017-09-25 MED ORDER — METOCLOPRAMIDE HCL 10 MG PO TABS
10.0000 mg | ORAL_TABLET | Freq: Three times a day (TID) | ORAL | Status: AC
  Administered 2017-09-25 – 2017-09-27 (×8): 10 mg via ORAL
  Filled 2017-09-25 (×7): qty 1

## 2017-09-25 MED ORDER — ACETAMINOPHEN 325 MG PO TABS: 325.0000 mg | ORAL_TABLET | Freq: Four times a day (QID) | ORAL | Status: DC | PRN

## 2017-09-25 MED ORDER — MENTHOL 3 MG MT LOZG
1.0000 | LOZENGE | OROMUCOSAL | Status: DC | PRN
  Filled 2017-09-25: qty 9

## 2017-09-25 MED ORDER — SODIUM CHLORIDE 0.9 % IV SOLN
INTRAVENOUS | Status: DC
  Administered 2017-09-25: 13:00:00 via INTRAVENOUS

## 2017-09-25 MED ORDER — VERAPAMIL HCL ER 200 MG PO CP24: 200.0000 mg | ORAL_CAPSULE | Freq: Every day | ORAL | Status: DC

## 2017-09-25 MED ORDER — PANTOPRAZOLE SODIUM 40 MG PO TBEC
40.0000 mg | DELAYED_RELEASE_TABLET | Freq: Two times a day (BID) | ORAL | Status: DC
  Administered 2017-09-25 – 2017-09-27 (×6): 40 mg via ORAL
  Filled 2017-09-25 (×6): qty 1

## 2017-09-25 MED ORDER — FERROUS SULFATE 325 (65 FE) MG PO TABS
325.0000 mg | ORAL_TABLET | Freq: Two times a day (BID) | ORAL | Status: DC
  Administered 2017-09-26 – 2017-09-28 (×5): 325 mg via ORAL
  Filled 2017-09-25 (×5): qty 1

## 2017-09-25 MED ORDER — PROPOFOL 500 MG/50ML IV EMUL
INTRAVENOUS | Status: AC
  Filled 2017-09-25: qty 50

## 2017-09-25 MED ORDER — ENOXAPARIN SODIUM 30 MG/0.3ML ~~LOC~~ SOLN
30.0000 mg | Freq: Two times a day (BID) | SUBCUTANEOUS | Status: DC
  Administered 2017-09-26 – 2017-09-28 (×5): 30 mg via SUBCUTANEOUS
  Filled 2017-09-25 (×5): qty 0.3

## 2017-09-25 MED ORDER — OXYCODONE HCL 5 MG PO TABS
5.0000 mg | ORAL_TABLET | ORAL | Status: DC | PRN
  Administered 2017-09-25 – 2017-09-27 (×6): 5 mg via ORAL
  Filled 2017-09-25 (×6): qty 1

## 2017-09-25 MED ORDER — ADULT MULTIVITAMIN W/MINERALS CH
1.0000 | ORAL_TABLET | Freq: Every day | ORAL | Status: DC
  Administered 2017-09-26 – 2017-09-27 (×2): 1 via ORAL
  Filled 2017-09-25 (×2): qty 1

## 2017-09-25 MED ORDER — CEFAZOLIN SODIUM-DEXTROSE 2-3 GM-%(50ML) IV SOLR
INTRAVENOUS | Status: AC
  Filled 2017-09-25: qty 50

## 2017-09-25 MED ORDER — OXYCODONE HCL 5 MG PO TABS
10.0000 mg | ORAL_TABLET | ORAL | Status: DC | PRN
  Administered 2017-09-25 – 2017-09-27 (×3): 10 mg via ORAL
  Filled 2017-09-25 (×3): qty 2

## 2017-09-25 MED ORDER — ATORVASTATIN CALCIUM 10 MG PO TABS
10.0000 mg | ORAL_TABLET | Freq: Every day | ORAL | Status: DC
  Administered 2017-09-25 – 2017-09-27 (×3): 10 mg via ORAL
  Filled 2017-09-25 (×3): qty 1

## 2017-09-25 MED ORDER — LORATADINE 10 MG PO TABS
10.0000 mg | ORAL_TABLET | Freq: Every day | ORAL | Status: DC
  Administered 2017-09-25 – 2017-09-27 (×2): 10 mg via ORAL
  Filled 2017-09-25 (×3): qty 1

## 2017-09-25 MED ORDER — MAGNESIUM HYDROXIDE 400 MG/5ML PO SUSP
30.0000 mL | Freq: Every day | ORAL | Status: DC | PRN
  Administered 2017-09-26: 30 mL via ORAL
  Filled 2017-09-25: qty 30

## 2017-09-25 MED ORDER — SERTRALINE HCL 50 MG PO TABS
50.0000 mg | ORAL_TABLET | Freq: Every day | ORAL | Status: DC
  Administered 2017-09-25 – 2017-09-27 (×3): 50 mg via ORAL
  Filled 2017-09-25 (×3): qty 1

## 2017-09-25 MED ORDER — BUPIVACAINE HCL (PF) 0.5 % IJ SOLN
INTRAMUSCULAR | Status: DC | PRN
  Administered 2017-09-25: 3 mL

## 2017-09-25 MED ORDER — TRAMADOL HCL 50 MG PO TABS
50.0000 mg | ORAL_TABLET | ORAL | Status: DC | PRN
  Administered 2017-09-28: 50 mg via ORAL
  Filled 2017-09-25: qty 1

## 2017-09-25 MED ORDER — TAMSULOSIN HCL 0.4 MG PO CAPS
0.4000 mg | ORAL_CAPSULE | Freq: Two times a day (BID) | ORAL | Status: DC
  Administered 2017-09-25 – 2017-09-27 (×6): 0.4 mg via ORAL
  Filled 2017-09-25 (×6): qty 1

## 2017-09-25 MED ORDER — METOCLOPRAMIDE HCL 5 MG/ML IJ SOLN: 5.0000 mg | Freq: Three times a day (TID) | INTRAMUSCULAR | Status: DC | PRN

## 2017-09-25 MED ORDER — FLEET ENEMA 7-19 GM/118ML RE ENEM
1.0000 | ENEMA | Freq: Once | RECTAL | Status: AC | PRN
  Administered 2017-09-27: 1 via RECTAL

## 2017-09-25 MED ORDER — ONDANSETRON HCL 4 MG PO TABS: 4.0000 mg | ORAL_TABLET | Freq: Four times a day (QID) | ORAL | Status: DC | PRN

## 2017-09-25 MED ORDER — CELECOXIB 200 MG PO CAPS
200.0000 mg | ORAL_CAPSULE | Freq: Two times a day (BID) | ORAL | Status: DC
  Administered 2017-09-25 – 2017-09-27 (×6): 200 mg via ORAL
  Filled 2017-09-25 (×6): qty 1

## 2017-09-25 MED ORDER — HYDROMORPHONE HCL 1 MG/ML IJ SOLN: 0.5000 mg | INTRAMUSCULAR | Status: DC | PRN

## 2017-09-25 MED ORDER — GLYCOPYRROLATE 0.2 MG/ML IJ SOLN
INTRAMUSCULAR | Status: DC | PRN
  Administered 2017-09-25: 0.2 mg via INTRAVENOUS

## 2017-09-25 MED ORDER — DIPHENHYDRAMINE HCL 12.5 MG/5ML PO ELIX: 12.5000 mg | ORAL_SOLUTION | ORAL | Status: DC | PRN

## 2017-09-25 MED ORDER — BUPIVACAINE HCL (PF) 0.5 % IJ SOLN
INTRAMUSCULAR | Status: AC
  Filled 2017-09-25: qty 10

## 2017-09-25 MED ORDER — ALUM & MAG HYDROXIDE-SIMETH 200-200-20 MG/5ML PO SUSP: 30.0000 mL | ORAL | Status: DC | PRN

## 2017-09-25 MED ORDER — BUPIVACAINE LIPOSOME 1.3 % IJ SUSP
INTRAMUSCULAR | Status: AC
  Filled 2017-09-25: qty 20

## 2017-09-25 MED ORDER — CELECOXIB 200 MG PO CAPS
400.0000 mg | ORAL_CAPSULE | Freq: Once | ORAL | Status: AC
  Administered 2017-09-25: 400 mg via ORAL

## 2017-09-25 MED ORDER — GABAPENTIN 300 MG PO CAPS: 300.0000 mg | ORAL_CAPSULE | Freq: Every day | ORAL | Status: DC

## 2017-09-25 MED ORDER — CHLORHEXIDINE GLUCONATE 4 % EX LIQD: 60.0000 mL | Freq: Once | CUTANEOUS | Status: DC

## 2017-09-25 MED ORDER — PHENYLEPHRINE HCL 10 MG/ML IJ SOLN
INTRAMUSCULAR | Status: AC
  Filled 2017-09-25: qty 1

## 2017-09-25 MED ORDER — NEOMYCIN-POLYMYXIN B GU 40-200000 IR SOLN
Status: AC
Start: 2017-09-25 — End: ?
  Filled 2017-09-25: qty 1

## 2017-09-25 MED ORDER — CEFAZOLIN SODIUM-DEXTROSE 2-3 GM-%(50ML) IV SOLR
INTRAVENOUS | Status: DC | PRN
  Administered 2017-09-25: 2 g via INTRAVENOUS

## 2017-09-25 MED ORDER — DEXAMETHASONE SODIUM PHOSPHATE 10 MG/ML IJ SOLN
8.0000 mg | Freq: Once | INTRAMUSCULAR | Status: AC
  Administered 2017-09-25: 8 mg via INTRAVENOUS

## 2017-09-25 MED ORDER — TRIAMCINOLONE ACETONIDE 0.025 % EX CREA
1.0000 "application " | TOPICAL_CREAM | Freq: Every day | CUTANEOUS | Status: DC | PRN
  Filled 2017-09-25: qty 15

## 2017-09-25 MED ORDER — ACETAMINOPHEN 10 MG/ML IV SOLN
INTRAVENOUS | Status: AC
  Filled 2017-09-25: qty 100

## 2017-09-25 MED ORDER — METOCLOPRAMIDE HCL 10 MG PO TABS: 5.0000 mg | ORAL_TABLET | Freq: Three times a day (TID) | ORAL | Status: DC | PRN

## 2017-09-25 MED ORDER — CAPTOPRIL 25 MG PO TABS
25.0000 mg | ORAL_TABLET | Freq: Two times a day (BID) | ORAL | Status: DC
  Administered 2017-09-25 – 2017-09-27 (×6): 25 mg via ORAL
  Filled 2017-09-25 (×7): qty 1

## 2017-09-25 MED ORDER — FENTANYL CITRATE (PF) 100 MCG/2ML IJ SOLN
INTRAMUSCULAR | Status: AC
  Filled 2017-09-25: qty 2

## 2017-09-25 MED ORDER — GABAPENTIN 300 MG PO CAPS: 300.0000 mg | ORAL_CAPSULE | Freq: Once | ORAL | Status: DC

## 2017-09-25 MED ORDER — BUPIVACAINE HCL (PF) 0.25 % IJ SOLN
INTRAMUSCULAR | Status: AC
  Filled 2017-09-25: qty 60

## 2017-09-25 MED ORDER — GLYCOPYRROLATE 0.2 MG/ML IJ SOLN
INTRAMUSCULAR | Status: AC
  Filled 2017-09-25: qty 1

## 2017-09-25 MED ORDER — SODIUM CHLORIDE 0.9 % IJ SOLN
INTRAMUSCULAR | Status: AC
  Filled 2017-09-25: qty 50

## 2017-09-25 MED ORDER — NEOMYCIN-POLYMYXIN B GU 40-200000 IR SOLN
Status: DC | PRN
  Administered 2017-09-25: 14 mL

## 2017-09-25 SURGICAL SUPPLY — 66 items
BATTERY INSTRU NAVIGATION (MISCELLANEOUS) ×12 IMPLANT
BLADE SAGITTAL 25.0X1.19X90 (BLADE) ×2 IMPLANT
BLADE SAGITTAL 25.0X1.19X90MM (BLADE) ×1
BLADE SAW 1/2 (BLADE) ×3 IMPLANT
BLADE SAW 70X12.5 (BLADE) IMPLANT
CANISTER SUCT 1200ML W/VALVE (MISCELLANEOUS) ×3 IMPLANT
CANISTER SUCT 3000ML PPV (MISCELLANEOUS) ×6 IMPLANT
CAPT KNEE TOTAL 3 ATTUNE ×3 IMPLANT
CEMENT HV SMART SET (Cement) ×6 IMPLANT
COOLER POLAR GLACIER W/PUMP (MISCELLANEOUS) ×3 IMPLANT
CUFF TOURN 24 STER (MISCELLANEOUS) ×3 IMPLANT
CUFF TOURN 30 STER DUAL PORT (MISCELLANEOUS) IMPLANT
DRAPE SHEET LG 3/4 BI-LAMINATE (DRAPES) ×3 IMPLANT
DRSG DERMACEA 8X12 NADH (GAUZE/BANDAGES/DRESSINGS) ×3 IMPLANT
DRSG OPSITE POSTOP 4X14 (GAUZE/BANDAGES/DRESSINGS) ×3 IMPLANT
DRSG TEGADERM 4X4.75 (GAUZE/BANDAGES/DRESSINGS) ×3 IMPLANT
DURAPREP 26ML APPLICATOR (WOUND CARE) ×6 IMPLANT
ELECT CAUTERY BLADE 6.4 (BLADE) ×3 IMPLANT
ELECT REM PT RETURN 9FT ADLT (ELECTROSURGICAL) ×3
ELECTRODE REM PT RTRN 9FT ADLT (ELECTROSURGICAL) ×1 IMPLANT
EX-PIN ORTHOLOCK NAV 4X150 (PIN) ×6 IMPLANT
GLOVE BIOGEL M STRL SZ7.5 (GLOVE) ×6 IMPLANT
GLOVE BIOGEL PI IND STRL 7.0 (GLOVE) ×4 IMPLANT
GLOVE BIOGEL PI IND STRL 9 (GLOVE) ×1 IMPLANT
GLOVE BIOGEL PI INDICATOR 7.0 (GLOVE) ×8
GLOVE BIOGEL PI INDICATOR 9 (GLOVE) ×2
GLOVE INDICATOR 8.0 STRL GRN (GLOVE) ×3 IMPLANT
GLOVE SURG SYN 9.0  PF PI (GLOVE) ×2
GLOVE SURG SYN 9.0 PF PI (GLOVE) ×1 IMPLANT
GOWN STRL REUS W/ TWL LRG LVL3 (GOWN DISPOSABLE) ×2 IMPLANT
GOWN STRL REUS W/TWL 2XL LVL3 (GOWN DISPOSABLE) ×3 IMPLANT
GOWN STRL REUS W/TWL LRG LVL3 (GOWN DISPOSABLE) ×4
HEMOVAC 400CC 10FR (MISCELLANEOUS) ×3 IMPLANT
HOLDER FOLEY CATH W/STRAP (MISCELLANEOUS) ×3 IMPLANT
HOOD PEEL AWAY FLYTE STAYCOOL (MISCELLANEOUS) ×6 IMPLANT
KIT TURNOVER KIT A (KITS) ×3 IMPLANT
KNIFE SCULPS 14X20 (INSTRUMENTS) ×3 IMPLANT
LABEL OR SOLS (LABEL) ×3 IMPLANT
NDL SAFETY ECLIPSE 18X1.5 (NEEDLE) ×1 IMPLANT
NEEDLE HYPO 18GX1.5 SHARP (NEEDLE) ×2
NEEDLE SPNL 20GX3.5 QUINCKE YW (NEEDLE) ×6 IMPLANT
NS IRRIG 500ML POUR BTL (IV SOLUTION) ×3 IMPLANT
PACK TOTAL KNEE (MISCELLANEOUS) ×3 IMPLANT
PAD WRAPON POLAR KNEE (MISCELLANEOUS) ×1 IMPLANT
PIN DRILL QUICK PACK ×3 IMPLANT
PIN FIXATION 1/8DIA X 3INL (PIN) ×3 IMPLANT
PULSAVAC PLUS IRRIG FAN TIP (DISPOSABLE) ×3
SOL .9 NS 3000ML IRR  AL (IV SOLUTION) ×2
SOL .9 NS 3000ML IRR UROMATIC (IV SOLUTION) ×1 IMPLANT
SOL PREP PVP 2OZ (MISCELLANEOUS) ×3
SOLUTION PREP PVP 2OZ (MISCELLANEOUS) ×1 IMPLANT
SPONGE DRAIN TRACH 4X4 STRL 2S (GAUZE/BANDAGES/DRESSINGS) ×3 IMPLANT
STAPLER SKIN PROX 35W (STAPLE) ×3 IMPLANT
STRAP TIBIA SHORT (MISCELLANEOUS) ×3 IMPLANT
SUCTION FRAZIER HANDLE 10FR (MISCELLANEOUS) ×2
SUCTION TUBE FRAZIER 10FR DISP (MISCELLANEOUS) ×1 IMPLANT
SUT VIC AB 0 CT1 36 (SUTURE) ×3 IMPLANT
SUT VIC AB 1 CT1 36 (SUTURE) ×6 IMPLANT
SUT VIC AB 2-0 CT2 27 (SUTURE) ×3 IMPLANT
SYR 20CC LL (SYRINGE) ×3 IMPLANT
SYR 30ML LL (SYRINGE) ×6 IMPLANT
TIP FAN IRRIG PULSAVAC PLUS (DISPOSABLE) ×1 IMPLANT
TOWEL OR 17X26 4PK STRL BLUE (TOWEL DISPOSABLE) ×3 IMPLANT
TOWER CARTRIDGE SMART MIX (DISPOSABLE) ×3 IMPLANT
TRAY FOLEY W/METER SILVER 16FR (SET/KITS/TRAYS/PACK) ×3 IMPLANT
WRAPON POLAR PAD KNEE (MISCELLANEOUS) ×3

## 2017-09-25 NOTE — Anesthesia Preprocedure Evaluation (Signed)
Anesthesia Evaluation  Patient identified by MRN, date of birth, ID band Patient awake    Reviewed: Allergy & Precautions, NPO status , Patient's Chart, lab work & pertinent test results  History of Anesthesia Complications Negative for: history of anesthetic complications  Airway Mallampati: I       Dental   Pulmonary neg sleep apnea, neg COPD, former smoker,           Cardiovascular hypertension, Pt. on medications + Peripheral Vascular Disease  (-) Past MI and (-) CHF (-) dysrhythmias (-) Valvular Problems/Murmurs     Neuro/Psych neg Seizures Anxiety Depression    GI/Hepatic GERD (hx, no problems now])  ,(+) Hepatitis -  Endo/Other  neg diabetes  Renal/GU negative Renal ROS     Musculoskeletal   Abdominal   Peds  Hematology   Anesthesia Other Findings   Reproductive/Obstetrics                             Anesthesia Physical Anesthesia Plan  ASA: III  Anesthesia Plan: Spinal   Post-op Pain Management:    Induction:   PONV Risk Score and Plan:   Airway Management Planned: Nasal Cannula  Additional Equipment:   Intra-op Plan:   Post-operative Plan:   Informed Consent: I have reviewed the patients History and Physical, chart, labs and discussed the procedure including the risks, benefits and alternatives for the proposed anesthesia with the patient or authorized representative who has indicated his/her understanding and acceptance.     Plan Discussed with:   Anesthesia Plan Comments:         Anesthesia Quick Evaluation

## 2017-09-25 NOTE — H&P (Signed)
The patient has been re-examined, and the chart reviewed, and there have been no interval changes to the documented history and physical.    The risks, benefits, and alternatives have been discussed at length. The patient expressed understanding of the risks benefits and agreed with plans for surgical intervention.  Adabelle Griffiths P. Elohim Brune, Jr. M.D.    

## 2017-09-25 NOTE — NC FL2 (Signed)
[place] LEVEL OF CARE SCREENING TOOL     IDENTIFICATION  Patient Name: Steven Bridges [facility] Birthdate: September 12, 1934 Sex: male Admission Date (Current Location): 09/25/2017  [employer] and [place] Number:  Engineering geologist and Address:  [facility], [address] [place], [place] [address]      Provider Number: [id number]  Attending Physician Name and Address:  [name], MD  Relative Name and Phone Number:       Current Level of Care: Hospital Recommended Level of Care: [place] Prior Approval Number:    Date Approved/Denied:   PASRR Number: [id number])  Discharge Plan: SNF    Current Diagnoses: Patient Active Problem List   Diagnosis Date Noted  . Anxiety 09/25/2017  . Benign prostatic hyperplasia 09/25/2017  . Coronary disease 09/25/2017  . Essential hypertension 09/25/2017  . GERD (gastroesophageal reflux disease) 09/25/2017  . Hypercholesterolemia 09/25/2017  . Peripheral neuropathy 09/25/2017  . S/P total knee arthroplasty 09/25/2017  . Primary osteoarthritis of right knee 06/16/2017  . Carotid artery disease ([place]) 03/06/2017  . Dysuria 02/23/2016  . Cystitis cystica 09/09/2014  . Incomplete emptying of bladder 09/03/2014  . Increased frequency of urination 09/03/2014  . Elevated PSA 10/07/2013  . Neuroma 03/20/2013  . Nodular prostate with urinary obstruction 04/04/2012    Orientation RESPIRATION BLADDER Height & Weight     Self, Time, Situation, Place  Normal Continent Weight: 164 lb 6.4 oz (74.6 kg) Height:  5\' 10"  (177.8 cm)  BEHAVIORAL SYMPTOMS/MOOD NEUROLOGICAL BOWEL NUTRITION STATUS      Continent Diet(Diet: Regular )  AMBULATORY STATUS COMMUNICATION OF NEEDS Skin   Extensive Assist Verbally Surgical wounds(Incision: Right Knee. )                       Personal Care Assistance Level of Assistance  Bathing, Feeding, Dressing Bathing Assistance: Limited  assistance Feeding assistance: Independent Dressing Assistance: Limited assistance     Functional Limitations Info  Sight, Hearing, Speech Sight Info: Adequate Hearing Info: Impaired Speech Info: Adequate    SPECIAL CARE FACTORS FREQUENCY  PT (By licensed PT), OT (By licensed OT)     PT Frequency: (5) OT Frequency: (5)            Contractures      Additional Factors Info  Code Status, Allergies Code Status Info: (Full Code. ) Allergies Info: (Gabapentin, Lamotrigine)           Current Medications (09/25/2017):  This is the current hospital active medication list Current Facility-Administered Medications  Medication Dose Route Frequency Provider Last Rate Last Dose  . 0.9 %  sodium chloride infusion   Intravenous Continuous Hooten, Laurice Record, MD 100 mL/hr at 09/25/17 1230    . acetaminophen (OFIRMEV) IV 1,000 mg  1,000 mg Intravenous Q6H Hooten, Laurice Record, MD   Stopped at 09/25/17 1245  . [START ON 09/26/2017] acetaminophen (TYLENOL) tablet 325-650 mg  325-650 mg Oral Q6H PRN Hooten, Laurice Record, MD      . alum & mag hydroxide-simeth (MAALOX/MYLANTA) 200-200-20 MG/5ML suspension 30 mL  30 mL Oral Q4H PRN Hooten, Laurice Record, MD      . atorvastatin (LIPITOR) tablet 10 mg  10 mg Oral Daily Hooten, Laurice Record, MD   10 mg at 09/25/17 1443  . bisacodyl (DULCOLAX) suppository 10 mg  10 mg Rectal Daily PRN Hooten, Laurice Record, MD      . captopril (CAPOTEN) tablet 25 mg  25 mg  Oral BID [name], MD   25 mg at 09/25/17 1230  . ceFAZolin (ANCEF) IVPB 2g/100 mL premix  2 g Intravenous Q6H Hooten, Laurice Record, MD   Stopped at 09/25/17 1402  . celecoxib (CELEBREX) capsule 200 mg  200 mg Oral BID [name], MD   200 mg at 09/25/17 1444  . dexamethasone (DECADRON) 10 MG/ML injection           . diphenhydrAMINE (BENADRYL) 12.5 MG/5ML elixir 12.5-25 mg  12.5-25 mg Oral Q4H PRN [name], MD      . Derrill Memo ON 09/26/2017] enoxaparin (LOVENOX) injection 30 mg  30 mg Subcutaneous Q12H Hooten, Laurice Record, MD      . famotidine (PEPCID) 20 MG tablet           . ferrous sulfate tablet 325 mg  325 mg Oral BID WC Hooten, Laurice Record, MD      . gabapentin (NEURONTIN) 300 MG capsule           . gabapentin (NEURONTIN) capsule 300 mg  300 mg Oral QHS Hooten, Laurice Record, MD      . HYDROmorphone (DILAUDID) injection 0.5-1 mg  0.5-1 mg Intravenous Q4H PRN Hooten, Laurice Record, MD      . loratadine (CLARITIN) tablet 10 mg  10 mg Oral Daily Hooten, Laurice Record, MD   10 mg at 09/25/17 1444  . magnesium hydroxide (MILK OF MAGNESIA) suspension 30 mL  30 mL Oral Daily PRN Hooten, Laurice Record, MD      . menthol-cetylpyridinium (CEPACOL) lozenge 3 mg  1 lozenge Oral PRN Hooten, Laurice Record, MD       Or  . phenol (CHLORASEPTIC) mouth spray 1 spray  1 spray Mouth/Throat PRN Hooten, Laurice Record, MD      . metoCLOPramide (REGLAN) tablet 5-10 mg  5-10 mg Oral Q8H PRN Hooten, Laurice Record, MD       Or  . metoCLOPramide (REGLAN) injection 5-10 mg  5-10 mg Intravenous Q8H PRN Hooten, Laurice Record, MD      . metoCLOPramide (REGLAN) tablet 10 mg  10 mg Oral TID AC & HS Hooten, Laurice Record, MD      . multivitamin with minerals tablet 1 tablet  1 tablet Oral Daily Hooten, Laurice Record, MD      . nitroGLYCERIN (NITROSTAT) SL tablet 0.4 mg  0.4 mg Sublingual Q5 min PRN Hooten, Laurice Record, MD      . ondansetron (ZOFRAN) tablet 4 mg  4 mg Oral Q6H PRN Hooten, Laurice Record, MD       Or  . ondansetron (ZOFRAN) injection 4 mg  4 mg Intravenous Q6H PRN Hooten, Laurice Record, MD      . oxyCODONE (Oxy IR/ROXICODONE) immediate release tablet 10 mg  10 mg Oral Q4H PRN Hooten, Laurice Record, MD      . oxyCODONE (Oxy IR/ROXICODONE) immediate release tablet 5 mg  5 mg Oral Q4H PRN Hooten, Laurice Record, MD      . pantoprazole (PROTONIX) EC tablet 40 mg  40 mg Oral BID [name], MD   40 mg at 09/25/17 1443  . senna-docusate (Senokot-S) tablet 1 tablet  1 tablet Oral BID Hooten, Laurice Record, MD   1 tablet at 09/25/17 1444  . sertraline (ZOLOFT) tablet 50 mg  50 mg Oral Daily Hooten, Laurice Record, MD   50 mg at  09/25/17 1443  . sodium phosphate (FLEET) 7-19 GM/118ML enema 1 enema  1 enema Rectal Once PRN Hooten,  Laurice Record, MD      . tamsulosin [facility]) capsule 0.4 mg  0.4 mg Oral BID [name], MD   0.4 mg at 09/25/17 1444  . traMADol (ULTRAM) tablet 50-100 mg  50-100 mg Oral Q4H PRN Hooten, Laurice Record, MD      . triamcinolone (KENALOG) 2.409 % cream 1 application  1 application Topical Daily PRN Hooten, Laurice Record, MD      . Verapamil HCl CR CP24 200 mg  200 mg Oral Daily Hooten, Laurice Record, MD         Discharge Medications: Please see discharge summary for a list of discharge medications.  Relevant Imaging Results:  Relevant Lab Results:   Additional Information (SSN: [id number])  Steven Bridges, Veronia Beets, LCSW

## 2017-09-25 NOTE — Anesthesia Post-op Follow-up Note (Signed)
Anesthesia QCDR form completed.        

## 2017-09-25 NOTE — Transfer of Care (Signed)
Immediate Anesthesia Transfer of Care Note  Patient: Steven Patalano [name]  Procedure(s) Performed: COMPUTER ASSISTED TOTAL KNEE ARTHROPLASTY (Right Knee)  Patient Location: PACU  Anesthesia Type:Spinal  Level of Consciousness: sedated  Airway & Oxygen Therapy: Patient Spontanous Breathing and Patient connected to nasal cannula oxygen  Post-op Assessment: Report given to RN and Post -op Vital signs reviewed and stable  Post vital signs: Reviewed and stable  Last Vitals:  Vitals Value Taken Time  BP 114/70 09/25/2017 10:38 AM  Temp 36.6 C 09/25/2017 10:38 AM  Pulse 77 09/25/2017 10:38 AM  Resp 16 09/25/2017 10:38 AM  SpO2 97 % 09/25/2017 10:38 AM  Vitals shown include unvalidated device data.  Last Pain:  Vitals:   09/25/17 0615  PainSc: 0-No pain         Complications: No apparent anesthesia complications

## 2017-09-25 NOTE — Progress Notes (Signed)
Chaplain was responding to a page of a pt saying scripture. Chaplain practiced active listening and pastoral presence. Chaplain prayed for Pt and asked that he will be God's peace in the unit. He agreed.   09/25/17 1100  Clinical Encounter Type  Visited With Patient  Visit Type Spiritual support  Referral From Nurse  Spiritual Encounters  Spiritual Needs Prayer;Emotional

## 2017-09-25 NOTE — Discharge Instructions (Signed)
°  Instructions after Total Knee Replacement ° ° Micala Saltsman P. Isaack Preble, Jr., M.D.    ° Dept. of Orthopaedics & Sports Medicine ° [facility] ° [address] ° [place], [place]  [address] ° Phone: [phone]   Fax: [phone] ° °  °DIET: °• Drink plenty of non-alcoholic fluids. °• Resume your normal diet. Include foods high in fiber. ° °ACTIVITY:  °• You may use crutches or a walker with weight-bearing as tolerated, unless instructed otherwise. °• You may be weaned off of the walker or crutches by your Physical Therapist.  °• Do NOT place pillows under the knee. Anything placed under the knee could limit your ability to straighten the knee.   °• Continue doing gentle exercises. Exercising will reduce the pain and swelling, increase motion, and prevent muscle weakness.   °• Please continue to use the TED compression stockings for 6 weeks. You may remove the stockings at night, but should reapply them in the morning. °• Do not drive or operate any equipment until instructed. ° °WOUND CARE:  °• Continue to use the PolarCare or ice packs periodically to reduce pain and swelling. °• You may bathe or shower after the staples are removed at the first office visit following surgery. ° °MEDICATIONS: °• You may resume your regular medications. °• Please take the pain medication as prescribed on the medication. °• Do not take pain medication on an empty stomach. °• You have been given a prescription for a blood thinner (Lovenox or Coumadin). Please take the medication as instructed. (NOTE: After completing a 2 week course of Lovenox, take one Enteric-coated aspirin once a day. This along with elevation will help reduce the possibility of phlebitis in your operated leg.) °• Do not drive or drink alcoholic beverages when taking pain medications. ° °CALL THE OFFICE FOR: °• Temperature above 101 degrees °• Excessive bleeding or drainage on the dressing. °• Excessive swelling, coldness, or paleness of the toes. °• Persistent  nausea and vomiting. ° °FOLLOW-UP:  °• You should have an appointment to return to the office in 10-14 days after surgery. °• Arrangements have been made for continuation of Physical Therapy (either home therapy or outpatient therapy). °  °

## 2017-09-25 NOTE — Op Note (Signed)
OPERATIVE NOTE  DATE OF SURGERY:  09/25/2017  PATIENT NAME:  Steven Cassar [facility]   DOB: 1935-05-30  MRN: [id number]  PRE-OPERATIVE DIAGNOSIS: Degenerative arthrosis of the right knee, primary  POST-OPERATIVE DIAGNOSIS:  Same  PROCEDURE:  Right total knee arthroplasty using computer-assisted navigation  SURGEON:  Marciano Sequin. M.D.  ASSISTANT:  Vance Peper, PA (present and scrubbed throughout the case, critical for assistance with exposure, retraction, instrumentation, and closure)  ANESTHESIA: spinal  ESTIMATED BLOOD LOSS: 50 mL  FLUIDS REPLACED: 1500 mL of crystalloid  TOURNIQUET TIME: 81 minutes  DRAINS: 2 medium Hemovac drains  SOFT TISSUE RELEASES: Anterior cruciate ligament, posterior cruciate ligament, deep medial collateral ligament, patellofemoral ligament  IMPLANTS UTILIZED: DePuy Attune size 8 posterior stabilized femoral component (cemented), size 8 rotating platform tibial component (cemented), 41 mm medialized dome patella (cemented), and a 5 mm stabilized rotating platform polyethylene insert.  INDICATIONS FOR SURGERY: Steven Muff [facility] Sr. is a 83 y.o. year old male with a long history of progressive knee pain. X-rays demonstrated severe degenerative changes in tricompartmental fashion. The patient had not seen any significant improvement despite conservative nonsurgical intervention. After discussion of the risks and benefits of surgical intervention, the patient expressed understanding of the risks benefits and agree with plans for total knee arthroplasty.   The risks, benefits, and alternatives were discussed at length including but not limited to the risks of infection, bleeding, nerve injury, stiffness, blood clots, the need for revision surgery, cardiopulmonary complications, among others, and they were willing to proceed.  PROCEDURE IN DETAIL: The patient was brought into the operating room and, after adequate spinal anesthesia was achieved, a tourniquet  was placed on the patient's upper thigh. The patient's knee and leg were cleaned and prepped with alcohol and DuraPrep and draped in the usual sterile fashion. A "timeout" was performed as per usual protocol. The lower extremity was exsanguinated using an Esmarch, and the tourniquet was inflated to 300 mmHg. An anterior longitudinal incision was made followed by a standard mid vastus approach. The deep fibers of the medial collateral ligament were elevated in a subperiosteal fashion off of the medial flare of the tibia so as to maintain a continuous soft tissue sleeve. The patella was subluxed laterally and the patellofemoral ligament was incised. Inspection of the knee demonstrated severe degenerative changes with full-thickness loss of articular cartilage. Osteophytes were debrided using a rongeur. Anterior and posterior cruciate ligaments were excised. Two 4.0 mm Schanz pins were inserted in the femur and into the tibia for attachment of the array of trackers used for computer-assisted navigation. Hip center was identified using a circumduction technique. Distal landmarks were mapped using the computer. The distal femur and proximal tibia were mapped using the computer. The distal femoral cutting guide was positioned using computer-assisted navigation so as to achieve a 5 distal valgus cut. The femur was sized and it was felt that a size 8 femoral component was appropriate. A size 8 femoral cutting guide was positioned and the anterior cut was performed and verified using the computer. This was followed by completion of the posterior and chamfer cuts. Femoral cutting guide for the central box was then positioned in the center box cut was performed.  Attention was then directed to the proximal tibia. Medial and lateral menisci were excised. The extramedullary tibial cutting guide was positioned using computer-assisted navigation so as to achieve a 0 varus-valgus alignment and 3 posterior slope. The cut was  performed and verified using the computer. The  proximal tibia was sized and it was felt that a size 8 tibial tray was appropriate. Tibial and femoral trials were inserted followed by insertion of a 5 mm polyethylene insert. This allowed for excellent mediolateral soft tissue balancing both in flexion and in full extension. Finally, the patella was cut and prepared so as to accommodate a 41 mm medialized dome patella. A patella trial was placed and the knee was placed through a range of motion with excellent patellar tracking appreciated. The femoral trial was removed after debridement of posterior osteophytes. The central post-hole for the tibial component was reamed followed by insertion of a keel punch. Tibial trials were then removed. Cut surfaces of bone were irrigated with copious amounts of normal saline with antibiotic solution using pulsatile lavage and then suctioned dry. Polymethylmethacrylate cement was prepared in the usual fashion using a vacuum mixer. Cement was applied to the cut surface of the proximal tibia as well as along the undersurface of a size 8 rotating platform tibial component. Tibial component was positioned and impacted into place. Excess cement was removed using [employer] fast streamer. Cement was then applied to the cut surfaces of the femur as well as along the posterior flanges of the size 8 femoral component. The femoral component was positioned and impacted into place. Excess cement was removed using [employer] fast streamer. A 5 mm polyethylene trial was inserted and the knee was brought into full extension with steady axial compression applied. Finally, cement was applied to the backside of a 41 mm medialized dome patella and the patellar component was positioned and patellar clamp applied. Excess cement was removed using [employer] fast streamer. After adequate curing of the cement, the tourniquet was deflated after a total tourniquet time of 81 minutes. Hemostasis was achieved using electrocautery. The  knee was irrigated with copious amounts of normal saline with antibiotic solution using pulsatile lavage and then suctioned dry. 20 mL of 1.3% Exparel and 60 mL of 0.25% Marcaine in 40 mL of normal saline was injected along the posterior capsule, medial and lateral gutters, and along the arthrotomy site. A 5 mm stabilized rotating platform polyethylene insert was inserted and the knee was placed through a range of motion with excellent mediolateral soft tissue balancing appreciated and excellent patellar tracking noted. 2 medium drains were placed in the wound bed and brought out through separate stab incisions. The medial parapatellar portion of the incision was reapproximated using interrupted sutures of #1 Vicryl. Subcutaneous tissue was approximated in layers using first #0 Vicryl followed #2-0 Vicryl. The skin was approximated with skin staples. A sterile dressing was applied.  The patient tolerated the procedure well and was transported to the recovery room in stable condition.    James P. Holley Bouche., M.D.

## 2017-09-25 NOTE — Evaluation (Addendum)
Physical Therapy Evaluation Patient Details Name: Steven Jutras [facility] Sr. MRN: [id number] DOB: 06/25/1934 Today's Date: 09/25/2017   History of Present Illness  Patient is an 83 year old male admitted from home s/p R TKA.  PMH includes peripheral neuropathy, OA, inguinal hernia, Htn, GERD, depression, CAD, BPH and anxiety.  Clinical Impression  Pt is an 83 year old male who lives in a one story home with his wife.  He is independent without AD at baseline.  Pt is in bed and reports 0/10 pain upon PT arrival. He is able to perform bed mobility with VC's for body mechanics and presents with overall WNL strength, with the exception of R LE.  Sensation is reported as slowly returning and pt states, " I can feel my neuropathy again".  Pt is able to stand from bedside with supervision for managing WBAT status and RW.  He is able to ambulate approx. 8 ft to recliner and sit down with supervision for management of RW and management of gait deviations.  PT issued and reviewed a supine LE there ex program and pt was able to complete sets of 10.  Pt reports 3/10 pain with movement.  Knee extension/flexion measured -10-65 degrees following activity.  He will benefit from skilled PT with focus on strength, balance, knee flexion ROM, proper use of AD and pain management.    Follow Up Recommendations Home health PT    Equipment Recommendations  Rolling walker with 5" wheels    Recommendations for Other Services       Precautions / Restrictions Precautions Precautions: Fall Restrictions Weight Bearing Restrictions: Yes RLE Weight Bearing: Weight bearing as tolerated      Mobility  Bed Mobility Overal bed mobility: Needs Assistance Bed Mobility: Supine to Sit     Supine to sit: Supervision     General bed mobility comments: VC's for sequencing and reassuring pt that he is able to flex R knee.  Transfers Overall transfer level: Needs assistance Equipment used: Rolling walker (2  wheeled) Transfers: Sit to/from Stand Sit to Stand: Supervision         General transfer comment: Able to stand without physical assist.  VC's for wt shift and managing WB status.  Ambulation/Gait Ambulation/Gait assistance: Supervision Ambulation Distance (Feet): 8 Feet Assistive device: Rolling walker (2 wheeled)     Gait velocity interpretation: <1.8 ft/sec, indicate of risk for recurrent falls General Gait Details: Step to gait leading with L LE.  Able to manage RW with min VC's.  Stairs            Wheelchair Mobility    Modified Rankin (Stroke Patients Only)       Balance Overall balance assessment: Modified Independent                                           Pertinent Vitals/Pain Pain Assessment: 0-10 Pain Score: 2  Pain Location: R knee with movement Pain Intervention(s): Limited activity within patient's tolerance;Monitored during session;Premedicated before session    Home Living Family/patient expects to be discharged to:: Private residence Living Arrangements: Spouse/significant other Available Help at Discharge: Family;Available 24 hours/day Type of Home: House Home Access: Stairs to enter Entrance Stairs-Rails: None Entrance Stairs-Number of Steps: 2 Home Layout: One level Home Equipment: Grab bars - tub/shower;Grab bars - toilet      Prior Function Level of Independence: Independent  Comments: Pt able to drive and is generally active at baseline.     Hand Dominance        Extremity/Trunk Assessment   Upper Extremity Assessment Upper Extremity Assessment: Overall WFL for tasks assessed    Lower Extremity Assessment Lower Extremity Assessment: Overall WFL for tasks assessed;RLE deficits/detail       Communication   Communication: No difficulties  Cognition Arousal/Alertness: Awake/alert Behavior During Therapy: WFL for tasks assessed/performed Overall Cognitive Status: Within Functional Limits  for tasks assessed                                 General Comments: Pt very talkative and needs to be redirected at times.      General Comments      Exercises Total Joint Exercises Ankle Circles/Pumps: 20 reps;AROM;Both;Seated Quad Sets: Right;Strengthening;5 reps;Seated Gluteal Sets: Strengthening;Both;10 reps;Seated Hip ABduction/ADduction: Strengthening;Right;10 reps;Seated Goniometric ROM: R knee ext/flex: -10-65 deg   Assessment/Plan    PT Assessment    PT Problem List         PT Treatment Interventions      PT Goals (Current goals can be found in the Care Plan section)  Acute Rehab PT Goals Patient Stated Goal: To return to church event on Wed night as soon as possible. PT Goal Formulation: With patient Time For Goal Achievement: 10/16/17 Potential to Achieve Goals: Good    Frequency  BID ADDENDED 09/27/2017, 730   Barriers to discharge        Co-evaluation               AM-PAC PT "6 Clicks" Daily Activity  Outcome Measure Difficulty turning over in bed (including adjusting bedclothes, sheets and blankets)?: A Little Difficulty moving from lying on back to sitting on the side of the bed? : A Little Difficulty sitting down on and standing up from a chair with arms (e.g., wheelchair, bedside commode, etc,.)?: A Little Help needed moving to and from a bed to chair (including a wheelchair)?: A Little Help needed walking in hospital room?: A Little Help needed climbing 3-5 steps with a railing? : A Little 6 Click Score: 18    End of Session Equipment Utilized During Treatment: Gait belt Activity Tolerance: Patient tolerated treatment well Patient left: in chair;with call bell/phone within reach;with chair alarm set;with nursing/sitter in room   PT Visit Diagnosis: Unsteadiness on feet (R26.81);Muscle weakness (generalized) (M62.81);Pain Pain - Right/Left: Right Pain - part of body: Knee    Time: 9509-3267 PT Time Calculation (min)  (ACUTE ONLY): 35 min   Charges:   PT Evaluation $PT Eval Low Complexity: 1 Low PT Treatments $Therapeutic Exercise: 8-22 mins   PT G Codes:   PT G-Codes **NOT FOR INPATIENT CLASS** Functional Assessment Tool Used: AM-PAC 6 Clicks Basic Mobility    Roxanne Gates, PT, DPT   Roxanne Gates 09/25/2017, 5:37 PM, ADDENDUM 09/27/2017, 730

## 2017-09-25 NOTE — Anesthesia Procedure Notes (Addendum)
Spinal  Patient location during procedure: OR Start time: 09/25/2017 7:23 AM End time: 09/25/2017 7:27 AM Staffing Performed: resident/CRNA  Preanesthetic Checklist Completed: patient identified, site marked, surgical consent, pre-op evaluation, timeout performed, IV checked, risks and benefits discussed and monitors and equipment checked Spinal Block Patient position: sitting Prep: ChloraPrep Patient monitoring: heart rate, continuous pulse ox, blood pressure and cardiac monitor Approach: midline Location: L3-4 Injection technique: single-shot Needle Needle type: Introducer and Pencan  Needle gauge: 24 G Needle length: 9 cm Additional Notes Negative paresthesia. Negative blood return. Positive free-flowing CSF. Expiration date of kit checked and confirmed. Patient tolerated procedure well, without complications.       

## 2017-09-25 NOTE — Progress Notes (Signed)
Patient is admitted to room 145 from OR. A&O x4. Wife at bedside post-op dressing, Hemovac, and foley in place. Bed alarm on for safety. Oriented to room, call light, TV and bed controls.

## 2017-09-26 LAB — CBC
HEMATOCRIT: 32.3 % — AB (ref 40.0–52.0)
Hemoglobin: 11.3 g/dL — ABNORMAL LOW (ref 13.0–18.0)
MCH: 35.7 pg — ABNORMAL HIGH (ref 26.0–34.0)
MCHC: 35 g/dL (ref 32.0–36.0)
MCV: 101.9 fL — ABNORMAL HIGH (ref 80.0–100.0)
Platelets: 183 10*3/uL (ref 150–440)
RBC: 3.17 MIL/uL — ABNORMAL LOW (ref 4.40–5.90)
RDW: 13.7 % (ref 11.5–14.5)
WBC: 9.3 10*3/uL (ref 3.8–10.6)

## 2017-09-26 LAB — BASIC METABOLIC PANEL
Anion gap: 5 (ref 5–15)
BUN: 13 mg/dL (ref 6–20)
CHLORIDE: 98 mmol/L — AB (ref 101–111)
CO2: 26 mmol/L (ref 22–32)
CREATININE: 0.83 mg/dL (ref 0.61–1.24)
Calcium: 8.3 mg/dL — ABNORMAL LOW (ref 8.9–10.3)
GFR calc non Af Amer: >60 mL/min (ref >60)
Glucose, Bld: 115 mg/dL — ABNORMAL HIGH (ref 65–99)
Potassium: 4.4 mmol/L (ref 3.5–5.1)
Sodium: 129 mmol/L — ABNORMAL LOW (ref 135–145)

## 2017-09-26 MED ORDER — ENOXAPARIN SODIUM 40 MG/0.4ML ~~LOC~~ SOLN: 40.0000 mg | SUBCUTANEOUS | 0 refills | Status: DC

## 2017-09-26 MED ORDER — OXYCODONE HCL 5 MG PO TABS: 5.0000 mg | ORAL_TABLET | ORAL | 0 refills | Status: DC | PRN

## 2017-09-26 MED ORDER — VERAPAMIL HCL ER 180 MG PO TBCR
180.0000 mg | EXTENDED_RELEASE_TABLET | Freq: Every day | ORAL | Status: DC
  Administered 2017-09-26 – 2017-09-27 (×2): 180 mg via ORAL
  Filled 2017-09-26 (×3): qty 1

## 2017-09-26 MED ORDER — TRAMADOL HCL 50 MG PO TABS: 50.0000 mg | ORAL_TABLET | ORAL | 0 refills | Status: DC | PRN

## 2017-09-26 NOTE — Progress Notes (Signed)
ORTHOPAEDICS PROGRESS NOTE  PATIENT NAME: Steven Bridges [facility] Sr. DOB: 1934-11-13  MRN: [id number]  POD # 1: Right total knee arthroplasty  Subjective: The patient rested well last night.  Pain is been under good control.  No nausea or vomiting. The patient tolerated physical therapy well on the afternoon after surgery.  Objective: Vital signs in last 24 hours: Temp:  [97 F (36.1 C)-97.9 F (36.6 C)] 97.9 F (36.6 C) (04/23 0424) Pulse Rate:  [53-79] 53 (04/23 0424) Resp:  [10-20] 17 (04/23 0424) BP: (105-157)/(57-88) 127/57 (04/23 0424) SpO2:  [96 %-100 %] 100 % (04/23 0424) Weight:  [74.6 kg (164 lb 6.4 oz)] 74.6 kg (164 lb 6.4 oz) (04/22 1220)  Intake/Output from previous day: 04/22 0701 - 04/23 0700 In: 4423 [P.O.:900; I.V.:3023; IV Piggyback:500] Out: 4425 [Urine:3850; Drains:525; Blood:50]  Recent Labs    09/26/17 0454  WBC 9.3  HGB 11.3*  HCT 32.3*  PLT 183  K 4.4  CL 98*  CO2 26  BUN 13  CREATININE 0.83  GLUCOSE 115*  CALCIUM 8.3*    EXAM General: Well-developed well-nourished male seen in no apparent discomfort. Lungs: clear to auscultation Cardiac: normal rate and regular rhythm Right lower extremity: Dressing is dry and intact.  Polar Care and Hemovac are in place and functioning.  The patient is able to perform an independent straight leg raise.  Homans test is negative. Neurologic: Awake, alert, and oriented.  Sensory and motor function are grossly intact.  Assessment: Right total knee arthroplasty  Secondary diagnoses: Peripheral neuropathy Hypercholesterolemia Hypertension Gastroesophageal reflux disease Diverticulosis Depression Coronary artery disease Benign prostatic hypertrophy  Plan: Today's goals were reviewed with the patient. Continue with physical therapy and occupational therapy as per total knee arthroplasty rehab protocol. Plan is to go Home after hospital stay. DVT Prophylaxis - Lovenox, Foot Pumps and TED hose  James P.  Holley Bouche M.D.

## 2017-09-26 NOTE — Progress Notes (Signed)
Physical Therapy Treatment Patient Details Name: Steven Bridges [facility] Sr. MRN: [id number] DOB: 07-13-1934 Today's Date: 09/26/2017    History of Present Illness Patient is an 83 year old male admitted from home s/p R TKA.  PMH includes peripheral neuropathy, OA, inguinal hernia, Htn, GERD, depression, CAD, BPH and anxiety.    PT Comments    Pt able to ambulate 200 ft with RW and VC's from PT for managing RW during turns.  PT introduced seated LE HEP and pt was able to complete sets of 12-15 of each.  Pt reported 0/10 pain at rest and 3/10 pain following ambulation and seated there ex.  PT educated pt concerning gait correction, management of RW and frequency of exercise.  PT also provided education concerning prolonged muscle contraction and slow controlled motion during there ex and pt demonstrated understanding.  Pt achieved 0-90 degrees of R knee extension/flexion following activity.  Pt is still slightly impulsive but requires less redirection as compared to yesterday.  Pt will continue to benefit from skilled PT with focus on strength, ROM, balance, functional mobility, pain management and safe use of RW.   Follow Up Recommendations  Home health PT     Equipment Recommendations  Rolling walker with 5" wheels    Recommendations for Other Services       Precautions / Restrictions Precautions Precautions: Fall Restrictions Weight Bearing Restrictions: Yes RLE Weight Bearing: Weight bearing as tolerated    Mobility  Bed Mobility Overal bed mobility: Modified Independent Bed Mobility: Supine to Sit     Supine to sit: Modified independent (Device/Increase time)     General bed mobility comments: no increase in pain, no assist needed  Transfers Overall transfer level: Needs assistance Equipment used: Rolling walker (2 wheeled) Transfers: Sit to/from Stand Sit to Stand: Supervision         General transfer comment: Able to stand from bed and chair without physical assist.   Repeats sequencing instructions for STS.  Ambulation/Gait Ambulation/Gait assistance: Min guard Ambulation Distance (Feet): 200 Feet Assistive device: Rolling walker (2 wheeled)     Gait velocity interpretation: 1.31 - 2.62 ft/sec, indicative of limited community ambulator General Gait Details: Step throught gait, moderate foot clearance, no rest break needed, VC's for management of RW during turns.  Discussed relationship between narrow BOS and fall risk.   Stairs             Wheelchair Mobility    Modified Rankin (Stroke Patients Only)       Balance Overall balance assessment: Modified Independent                                          Cognition Arousal/Alertness: Awake/alert Behavior During Therapy: WFL for tasks assessed/performed Overall Cognitive Status: Within Functional Limits for tasks assessed                                        Exercises Total Joint Exercises Ankle Circles/Pumps: 20 reps;AROM;Both;Seated Quad Sets: Strengthening;Right;15 reps;Seated Towel Squeeze: Strengthening;Right;15 reps;Seated Short Arc Quad: Strengthening;Right;15 reps;Seated Heel Slides: Right;Strengthening;15 reps;Seated Long Arc Quad: AROM;Right;10 reps;Seated Goniometric ROM: R knee extension/flexion: 0-90 deg    General Comments        Pertinent Vitals/Pain Pain Assessment: 0-10 Pain Score: 3  Pain Location: R knee Pain Descriptors /  Indicators: Discomfort Pain Intervention(s): Limited activity within patient's tolerance;Monitored during session    [place] expects to be discharged to:: Private residence Living Arrangements: Spouse/significant other Available Help at Discharge: Family;Available 24 hours/day Type of Home: House Home Access: Stairs to enter Entrance Stairs-Rails: None Home Layout: One level Home Equipment: Grab bars - tub/shower;Grab bars - toilet      Prior Function Level of  Independence: Independent      Comments: Pt independent in all aspects and actively volunteers and participates with church, drives, and reports having lost ~20lbs prior to surgery with exercise and "feels great". No falls in past 12 months.   PT Goals (current goals can now be found in the care plan section) Acute Rehab PT Goals Patient Stated Goal: To return to church event on Wed night as soon as possible. PT Goal Formulation: With patient Time For Goal Achievement: 10/16/17 Potential to Achieve Goals: Good Progress towards PT goals: Progressing toward goals    Frequency    BID      PT Plan Current plan remains appropriate    Co-evaluation              AM-PAC PT "6 Clicks" Daily Activity  Outcome Measure  Difficulty turning over in bed (including adjusting bedclothes, sheets and blankets)?: A Little Difficulty moving from lying on back to sitting on the side of the bed? : A Little Difficulty sitting down on and standing up from a chair with arms (e.g., wheelchair, bedside commode, etc,.)?: A Little Help needed moving to and from a bed to chair (including a wheelchair)?: A Little Help needed walking in hospital room?: A Little Help needed climbing 3-5 steps with a railing? : A Little 6 Click Score: 18    End of Session Equipment Utilized During Treatment: Gait belt Activity Tolerance: Patient tolerated treatment well Patient left: in chair;with call bell/phone within reach;with chair alarm set Nurse Communication: Mobility status PT Visit Diagnosis: Unsteadiness on feet (R26.81);Muscle weakness (generalized) (M62.81);Pain Pain - Right/Left: Right Pain - part of body: Knee     Time: 1020-1050 PT Time Calculation (min) (ACUTE ONLY): 30 min  Charges:  $Gait Training: 8-22 mins $Therapeutic Exercise: 8-22 mins                    G Codes:  Functional Assessment Tool Used: AM-PAC 6 Clicks Basic Mobility    Roxanne Gates, PT, DPT    Roxanne Gates 09/26/2017, 11:13 AM

## 2017-09-26 NOTE — Progress Notes (Signed)
Clinical Social Worker (CSW) received SNF consult. PT is recommending home health. RN case manager aware of above. Please reconsult if future social work needs arise. CSW signing off.   Forestine Macho, LCSW [phone] 

## 2017-09-26 NOTE — Evaluation (Signed)
Occupational Therapy Evaluation Patient Details Name: Steven [facility] Sr. MRN: [id number] DOB: 1935-03-14 Today's Date: 09/26/2017    History of Present Illness Patient is an 83 year old male admitted from home s/p R TKA.  PMH includes peripheral neuropathy, OA, inguinal hernia, Htn, GERD, depression, CAD, BPH and anxiety.   Clinical Impression   Pt seen for OT evaluation POD#1 s/p R TKR.  Pt was independent in all aspects and active in his community and doing regular exercise in anticipation of the surgery. Pt is very eager to return to PLOF. Pt able to don shoes after set up while seated EOB with minimal pain and R knee approx 90 degrees. Denies difficulty with donning LB clothing. Pt instructed in spouse assist for compression stockings. Educated in polar care mgt. Pt demo'd excellent recall of DME use and safety while performing transfers at supervision level. OT provided recommendations for home/routines modifications to increase safety with ADL and mobility and prevent falls. Pt verbalized understanding of all education/training provided. Overall, pt progressing very well and safe to return home with minimal assist from spouse. Pt left in recliner with all needs in reach. No additional skilled OT needs at this time. Will sign off.    Follow Up Recommendations  No OT follow up    Equipment Recommendations  None recommended by OT    Recommendations for Other Services       Precautions / Restrictions Precautions Precautions: Fall Restrictions Weight Bearing Restrictions: Yes RLE Weight Bearing: Weight bearing as tolerated      Mobility Bed Mobility Overal bed mobility: Needs Assistance Bed Mobility: Supine to Sit     Supine to sit: Supervision     General bed mobility comments: no increase in pain, no assist needed  Transfers Overall transfer level: Needs assistance Equipment used: Rolling walker (2 wheeled) Transfers: Sit to/from Stand Sit to Stand: Supervision          General transfer comment: good carryover from previous PT session for sequencing and safety, supervision, no LOB    Balance Overall balance assessment: Modified Independent                                         ADL either performed or assessed with clinical judgement   ADL Overall ADL's : Modified independent                                       General ADL Comments: Pt able to don shoes after set up while seated EOB with minimal pain and R knee approx 90 degrees. Denies difficulty with donning LB clothing. Pt instructed in spouse assist for compression stockings. Educated in polar care mgt. Pt verbalized understanding of all education/training provided.      Vision Baseline Vision/History: Wears glasses Wears Glasses: Reading only Patient Visual Report: No change from baseline       Perception     Praxis      Pertinent Vitals/Pain Pain Assessment: 0-10 Pain Score: 2  Pain Location: R knee with movement (was a 3/10 while supine in bed with bone foam) Pain Descriptors / Indicators: Discomfort Pain Intervention(s): Limited activity within patient's tolerance;Monitored during session;Premedicated before session;Repositioned;Ice applied     Hand Dominance Right   Extremity/Trunk Assessment Upper Extremity Assessment Upper Extremity Assessment: Overall WFL for tasks  assessed   Lower Extremity Assessment Lower Extremity Assessment: Overall WFL for tasks assessed(anticipated strength/ROM deficits post-op, however only very mild)   Cervical / Trunk Assessment Cervical / Trunk Assessment: Normal   Communication Communication Communication: No difficulties(wears hearing aides)   Cognition Arousal/Alertness: Awake/alert Behavior During Therapy: WFL for tasks assessed/performed Overall Cognitive Status: Within Functional Limits for tasks assessed                                     General Comments        Exercises     Shoulder Instructions      Home Living Family/patient expects to be discharged to:: Private residence Living Arrangements: Spouse/significant other Available Help at Discharge: Family;Available 24 hours/day Type of Home: House Home Access: Stairs to enter [employer] of Steps: 2 Entrance Stairs-Rails: None Home Layout: One level     Bathroom Shower/Tub: Occupational psychologist: Handicapped height Bathroom Accessibility: Yes   Home Equipment: Grab bars - tub/shower;Grab bars - toilet          Prior Functioning/Environment Level of Independence: Independent        Comments: Pt independent in all aspects and actively volunteers and participates with church, drives, and reports having lost ~20lbs prior to surgery with exercise and "feels great". No falls in past 12 months.        OT Problem List:        OT Treatment/Interventions:      OT Goals(Current goals can be found in the care plan section) Acute Rehab OT Goals Patient Stated Goal: To return to church event on Wed night as soon as possible. OT Goal Formulation: All assessment and education complete, DC therapy  OT Frequency:     Barriers to D/C:            Co-evaluation              AM-PAC PT "6 Clicks" Daily Activity     Outcome Measure Help from another person eating meals?: None Help from another person taking care of personal grooming?: None Help from another person toileting, which includes using toliet, bedpan, or urinal?: A Little Help from another person bathing (including washing, rinsing, drying)?: A Little Help from another person to put on and taking off regular upper body clothing?: None Help from another person to put on and taking off regular lower body clothing?: None 6 Click Score: 22   End of Session Equipment Utilized During Treatment: Gait belt;Rolling walker  Activity Tolerance: Patient tolerated treatment well Patient left: in chair;with  call bell/phone within reach;with chair alarm set;Other (comment)(polar care in place)  OT Visit Diagnosis: Other abnormalities of gait and mobility (R26.89)                Time: 7001-7494 OT Time Calculation (min): 37 min Charges:  OT General Charges $OT Visit: 1 Visit OT Evaluation $OT Eval Low Complexity: 1 Low OT Treatments $Self Care/Home Management : 23-37 mins   Jeni Salles, MPH, MS, OTR/L ascom [phone] 09/26/17, 10:10 AM

## 2017-09-26 NOTE — Anesthesia Postprocedure Evaluation (Signed)
Anesthesia Post Note  Patient: Steven Bridges [facility] Sr.  Procedure(s) Performed: COMPUTER ASSISTED TOTAL KNEE ARTHROPLASTY (Right Knee)  Patient location during evaluation: Nursing Unit Anesthesia Type: Spinal Level of consciousness: awake and awake and alert Pain management: pain level controlled Vital Signs Assessment: post-procedure vital signs reviewed and stable Respiratory status: spontaneous breathing Cardiovascular status: blood pressure returned to baseline Postop Assessment: no headache Anesthetic complications: no     Last Vitals:  Vitals:   09/25/17 2348 09/26/17 0424  BP: (!) 105/59 (!) 127/57  Pulse: 64 (!) 53  Resp: 17 17  Temp: 36.5 C 36.6 C  SpO2: 99% 100%    Last Pain:  Vitals:   09/26/17 0424  TempSrc: Oral  PainSc:                  Buckner [place]

## 2017-09-26 NOTE — Care Management Note (Signed)
Case Management Note  Patient Details  Name: Steven Longoria [facility] Sr. MRN: [id number] Date of Birth: 24-May-1935  Subjective/Objective:  POD  # 1 right total knee arthroplasty. Met with patient at bedside to discuss discharge planning. He lives with his wife. He will need a walker. Ordered from [facility]. Offered choice of home health agencies. Referral to [facility] for HHPT. Pharmacy: [facility]:  [phone].Will check Lovenox prior to discharge.                 Action/Plan: [facility] for HHPT, Walker delivered to patient.   Expected Discharge Date:  09/27/17               Expected Discharge Plan:  [place]  In-House Referral:     Discharge planning Services  CM Consult  Post Acute Care Choice:  Durable Medical Equipment, Home Health Choice offered to:     DME Arranged:  Walker rolling DME Agency:  [place] [facility] [place]:  PT [facility] Agency:  [facility] ([facility]  Status of Service:  In process, will continue to follow  If discussed at Long Length of Stay Meetings, dates discussed:    Additional Comments:  Jolly Mango, RN 09/26/2017, 3:01 PM

## 2017-09-26 NOTE — Discharge Summary (Signed)
Physician Discharge Summary  Patient ID: Steven Bridges [facility] Sr. MRN: [id number] DOB/AGE: [birth year] 83 y.o.  Admit date: 09/25/2017 Discharge date: 09/27/2017  Admission Diagnoses:  PRIMARY OSTEOARTHRITIS OF RIGHT KNEE   Discharge Diagnoses: Patient Active Problem List   Diagnosis Date Noted  . Anxiety 09/25/2017  . Benign prostatic hyperplasia 09/25/2017  . Coronary disease 09/25/2017  . Essential hypertension 09/25/2017  . GERD (gastroesophageal reflux disease) 09/25/2017  . Hypercholesterolemia 09/25/2017  . Peripheral neuropathy 09/25/2017  . S/P total knee arthroplasty 09/25/2017  . Primary osteoarthritis of right knee 06/16/2017  . Carotid artery disease ([place]) 03/06/2017  . Dysuria 02/23/2016  . Cystitis cystica 09/09/2014  . Incomplete emptying of bladder 09/03/2014  . Increased frequency of urination 09/03/2014  . Elevated PSA 10/07/2013  . Neuroma 03/20/2013  . Nodular prostate with urinary obstruction 04/04/2012    Past Medical History:  Diagnosis Date  . Anxiety   . BPH (benign prostatic hyperplasia)   . Colon polyps   . Coronary artery disease   . Depression   . Diverticulosis   . GERD (gastroesophageal reflux disease)   . HBP (high blood pressure)   . Heart trouble   . Hepatitis   . 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 High cholesterol   . Inguinal hernia   . Osteoarthritis   . Peripheral neuropathy   . Prostate disorder      Transfusion: No transfusions during this admission   Consultants (if any):   Discharged Condition: Improved  Hospital Course: Steven Radigan [facility] Sr. is an 83 y.o. male who was admitted 09/25/2017 with a diagnosis of degenerative arthrosis right knee and went to the operating room on 09/25/2017 and underwent the above named procedures.    Surgeries:Procedure(s): COMPUTER ASSISTED TOTAL KNEE ARTHROPLASTY on 09/25/2017  PRE-OPERATIVE DIAGNOSIS: Degenerative arthrosis of the right knee,  primary  POST-OPERATIVE DIAGNOSIS:  Same  PROCEDURE:  Right total knee arthroplasty using computer-assisted navigation  SURGEON:  Marciano Sequin. M.D.  ASSISTANT:  Vance Peper, PA (present and scrubbed throughout the case, critical for assistance with exposure, retraction, instrumentation, and closure)  ANESTHESIA: spinal  ESTIMATED BLOOD LOSS: 50 mL  FLUIDS REPLACED: 1500 mL of crystalloid  TOURNIQUET TIME: 81 minutes  DRAINS: 2 medium Hemovac drains  SOFT TISSUE RELEASES: Anterior cruciate ligament, posterior cruciate ligament, deep medial collateral ligament, patellofemoral ligament  IMPLANTS UTILIZED: DePuy Attune size 8 posterior stabilized femoral component (cemented), size 8 rotating platform tibial component (cemented), 41 mm medialized dome patella (cemented), and a 5 mm stabilized rotating platform polyethylene insert.  INDICATIONS FOR SURGERY: Steven Scobie [facility] Sr. is a 83 y.o. year old male with a long history of progressive knee pain. X-rays demonstrated severe degenerative changes in tricompartmental fashion. The patient had not seen any significant improvement despite conservative nonsurgical intervention. After discussion of the risks and benefits of surgical intervention, the patient expressed understanding of the risks benefits and agree with plans for total knee arthroplasty.    Patient tolerated the surgery well. No complications .Patient was taken to PACU where she was stabilized and then transferred to the orthopedic floor.  Patient started on Lovenox 30 mg q 12 hrs. Foot pumps applied bilaterally at 80 mm hgb. Heels elevated off bed with rolled towels. No evidence of DVT. Calves non tender. Negative Homan. Physical therapy started on day #1 for gait training and transfer with OT starting on  day #1 for ADL and assisted devices. Patient has done  well with therapy. Ambulated greater than 200 feet upon being discharged.  Was able to ascend and descend 4  steps safely and independently  Patient's IV And Foley were discontinued on day #1 with Hemovac being discontinued on day #2. Dressing was changed on day 2 prior to patient being discharged   He was given perioperative antibiotics:  Anti-infectives (From admission, onward)   Start     Dose/Rate Route Frequency Ordered Stop   09/25/17 1230  ceFAZolin (ANCEF) IVPB 2g/100 mL premix     2 g 200 mL/hr over 30 Minutes Intravenous Every 6 hours 09/25/17 1215 09/26/17 0600   09/25/17 0634  ceFAZolin (ANCEF) 2-3 GM-%(50ML) IVPB SOLR    Note to Pharmacy:  Steven Bridges   : cabinet override      09/25/17 0634 09/25/17 0742   09/24/17 0512  ceFAZolin (ANCEF) IVPB 2g/100 mL premix     2 g 200 mL/hr over 30 Minutes Intravenous On call to O.R. 09/24/17 5643 09/25/17 0559    .  He was fitted with AV 1 compression foot pump devices, instructed on heel pumps, early ambulation, and fitted with TED stockings bilaterally for DVT prophylaxis.  He benefited maximally from the hospital stay and there were no complications.    Recent vital signs:  Vitals:   09/25/17 2348 09/26/17 0424  BP: (!) 105/59 (!) 127/57  Pulse: 64 (!) 53  Resp: 17 17  Temp: 97.7 F (36.5 C) 97.9 F (36.6 C)  SpO2: 99% 100%    Recent laboratory studies:  Lab Results  Component Value Date   HGB 11.3 (L) 09/26/2017   HGB 13.8 09/13/2017   HGB 14.7 07/11/2017   Lab Results  Component Value Date   WBC 9.3 09/26/2017   PLT 183 09/26/2017   Lab Results  Component Value Date   INR 0.95 09/13/2017   Lab Results  Component Value Date   NA 129 (L) 09/26/2017   K 4.4 09/26/2017   CL 98 (L) 09/26/2017   CO2 26 09/26/2017   BUN 13 09/26/2017   CREATININE 0.83 09/26/2017   GLUCOSE 115 (H) 09/26/2017    Discharge Medications:   Allergies as of 09/26/2017      Reactions   Gabapentin Other (See Comments)   Leg edema   Lamotrigine Rash      Medication List    STOP taking these medications   aspirin 81 MG  tablet   ibuprofen 600 MG tablet Commonly known as:  ADVIL,MOTRIN     TAKE these medications   atorvastatin 10 MG tablet Commonly known as:  LIPITOR Take 10 mg by mouth daily.   captopril 25 MG tablet Commonly known as:  CAPOTEN Take 25 mg by mouth 2 (two) times daily.   CENTRUM SILVER PO Take 1 tablet by mouth daily.   enoxaparin 40 MG/0.4ML injection Commonly known as:  LOVENOX Inject 0.4 mLs (40 mg total) into the skin daily for 14 days. Start taking on:  09/28/2017   fexofenadine 180 MG tablet Commonly known as:  ALLEGRA Take 180 mg by mouth daily as needed for allergies or rhinitis.   Melatonin 5 MG Tabs Take 5 mg by mouth at bedtime as needed (sleep).   nitroGLYCERIN 0.4 MG SL tablet Commonly known as:  NITROSTAT Place 0.4 mg under the tongue every 5 (five) minutes as needed for chest pain.   oxyCODONE 5 MG immediate release tablet Commonly known as:  Oxy IR/ROXICODONE Take 1 tablet (5 mg total) by mouth every 4 (  four) hours as needed for moderate pain (pain score 4-6).   sertraline 50 MG tablet Commonly known as:  ZOLOFT Take 50 mg by mouth daily.   tamsulosin 0.4 MG Caps capsule Commonly known as:  FLOMAX Take 0.4 mg by mouth 2 (two) times daily.   traMADol 50 MG tablet Commonly known as:  ULTRAM Take 1-2 tablets (50-100 mg total) by mouth every 4 (four) hours as needed for moderate pain.   triamcinolone 0.025 % cream Commonly known as:  KENALOG Apply 1 application topically daily as needed for rash. Mixed with eucerin   Verapamil HCl CR 200 MG Cp24 Take 200 mg by mouth daily.            Durable Medical Equipment  (From admission, onward)        Start     Ordered   09/25/17 1215  DME Walker rolling  Once    Question:  Patient needs a walker to treat with the following condition  Answer:  Total knee replacement status   09/25/17 1215   09/25/17 1215  DME Bedside commode  Once    Question:  Patient needs a bedside commode to treat with the  following condition  Answer:  Total knee replacement status   09/25/17 1215      Diagnostic Studies: Dg Knee Right Port  Result Date: 09/25/2017 CLINICAL DATA:  Status post right total knee joint prosthesis placement. Postop day 0. EXAM: PORTABLE RIGHT KNEE - 1-2 VIEW COMPARISON:  None in PACs FINDINGS: The patient has undergone right total knee joint prosthesis placement. Radiographic positioning of the prosthetic components is good. The interface with the native bone appears normal. There is a small suprapatellar effusion. Surgical drain lines and skin staples are present. IMPRESSION: No immediate postprocedure complication following total right knee joint replacement. Electronically Signed   By: David  Martinique M.D.   On: 09/25/2017 10:54    Disposition:   Discharge Instructions    Increase activity slowly   Complete by:  As directed       Follow-up Information    Watt Climes, PA On 10/10/2017.   Specialty:  Physician Assistant Why:  at 1:15pm Contact information: [place] [address] [phone]        [name], MD On 11/07/2017.   Specialty:  Orthopedic Surgery Why:  at 9:45am Contact information: [place] [address] [phone]            Signed: Watt Climes 09/26/2017, 7:40 AM

## 2017-09-26 NOTE — Progress Notes (Signed)
Physical Therapy Treatment Patient Details Name: Steven Limones [facility] Sr. MRN: [id number] DOB: 27-Aug-1934 Today's Date: 09/26/2017    History of Present Illness Patient is an 83 year old male admitted from home s/p R TKA.  PMH includes peripheral neuropathy, OA, inguinal hernia, Htn, GERD, depression, CAD, BPH and anxiety.    PT Comments    Pt able to perform STS without VC's for use of RW.  He was able to ambulate 200 ft again, though he reported being "tired" and 6/10 pain this afternoon.  Pt still presents with a good gait pattern and foot clearance for decreased fall risk.  He is adapting to using the RW and requires less VC's.  Pt was able to perform bed mobility without assistance with mobility of RLE.  He demonstrated knowledge of supine exercises in bed and PT advised pt to perform these exercises later today, educating concerning importance of frequent, low intensity exercise.  Pt expressed understanding.  He will continue to benefit from skilled PT with focus on strength, balance, gait, safe use of RW, transfers and pain management.   Follow Up Recommendations  Home health PT     Equipment Recommendations  Rolling walker with 5" wheels    Recommendations for Other Services       Precautions / Restrictions Precautions Precautions: Fall Restrictions Weight Bearing Restrictions: Yes RLE Weight Bearing: Weight bearing as tolerated    Mobility  Bed Mobility Overal bed mobility: Modified Independent Bed Mobility: Supine to Sit     Supine to sit: Modified independent (Device/Increase time)        Transfers Overall transfer level: Needs assistance Equipment used: Rolling walker (2 wheeled) Transfers: Sit to/from Stand Sit to Stand: Supervision         General transfer comment: Able to stand from bed and chair without physical assist.  Repeats sequencing instructions for STS.  Ambulation/Gait Ambulation/Gait assistance: Min guard Ambulation Distance (Feet): 200  Feet Assistive device: Rolling walker (2 wheeled)     Gait velocity interpretation: 1.31 - 2.62 ft/sec, indicative of limited community ambulator General Gait Details: Step throught gait, moderate foot clearance, no rest break needed, VC's for management of RW during turns.     Stairs             Wheelchair Mobility    Modified Rankin (Stroke Patients Only)       Balance Overall balance assessment: Modified Independent                                          Cognition Arousal/Alertness: Awake/alert Behavior During Therapy: WFL for tasks assessed/performed Overall Cognitive Status: Within Functional Limits for tasks assessed                                        Exercises Total Joint Exercises Ankle Circles/Pumps: 20 reps;AROM;Both;Supine Quad Sets: Strengthening;15 reps;Supine Short Arc Quad: Strengthening;10 reps;Right;Supine Heel Slides: Right;10 reps;AROM;Supine Hip ABduction/ADduction: Strengthening;Right;10 reps;Supine    General Comments        Pertinent Vitals/Pain Pain Assessment: 0-10 Pain Score: 6  Pain Location: R knee Pain Intervention(s): Monitored during session;Limited activity within patient's tolerance    Home Living                      Prior Function  PT Goals (current goals can now be found in the care plan section)      Frequency    BID      PT Plan Current plan remains appropriate    Co-evaluation              AM-PAC PT "6 Clicks" Daily Activity  Outcome Measure  Difficulty turning over in bed (including adjusting bedclothes, sheets and blankets)?: A Little Difficulty moving from lying on back to sitting on the side of the bed? : A Little Difficulty sitting down on and standing up from a chair with arms (e.g., wheelchair, bedside commode, etc,.)?: A Little Help needed moving to and from a bed to chair (including a wheelchair)?: A Little Help needed walking  in hospital room?: A Little Help needed climbing 3-5 steps with a railing? : A Little 6 Click Score: 18    End of Session Equipment Utilized During Treatment: Gait belt Activity Tolerance: Patient tolerated treatment well Patient left: with call bell/phone within reach;in bed;with bed alarm set   PT Visit Diagnosis: Unsteadiness on feet (R26.81);Muscle weakness (generalized) (M62.81);Pain Pain - Right/Left: Right Pain - part of body: Knee     Time: 1510-1540 PT Time Calculation (min) (ACUTE ONLY): 30 min  Charges:  $Therapeutic Exercise: 23-37 mins                    G Codes:  Functional Assessment Tool Used: AM-PAC 6 Clicks Basic Mobility    Steven Bridges, PT, DPT    Steven Bridges 09/26/2017, 5:07 PM

## 2017-09-27 LAB — BASIC METABOLIC PANEL
Anion gap: 2 — ABNORMAL LOW (ref 5–15)
BUN: 14 mg/dL (ref 6–20)
CHLORIDE: 104 mmol/L (ref 101–111)
CO2: 30 mmol/L (ref 22–32)
Calcium: 8.3 mg/dL — ABNORMAL LOW (ref 8.9–10.3)
Creatinine, Ser: 0.88 mg/dL (ref 0.61–1.24)
GFR calc Af Amer: >60 mL/min (ref >60)
GFR calc non Af Amer: >60 mL/min (ref >60)
Glucose, Bld: 105 mg/dL — ABNORMAL HIGH (ref 65–99)
Potassium: 4.3 mmol/L (ref 3.5–5.1)
Sodium: 136 mmol/L (ref 135–145)

## 2017-09-27 LAB — CBC
HCT: 32.9 % — ABNORMAL LOW (ref 40.0–52.0)
Hemoglobin: 11.3 g/dL — ABNORMAL LOW (ref 13.0–18.0)
MCH: 34.8 pg — AB (ref 26.0–34.0)
MCHC: 34.3 g/dL (ref 32.0–36.0)
MCV: 101.7 fL — AB (ref 80.0–100.0)
PLATELETS: 182 10*3/uL (ref 150–440)
RBC: 3.23 MIL/uL — ABNORMAL LOW (ref 4.40–5.90)
RDW: 14 % (ref 11.5–14.5)
WBC: 6.6 10*3/uL (ref 3.8–10.6)

## 2017-09-27 MED ORDER — MAGNESIUM CITRATE PO SOLN
1.0000 | Freq: Once | ORAL | Status: AC
  Administered 2017-09-27: 1 via ORAL
  Filled 2017-09-27 (×2): qty 296

## 2017-09-27 MED ORDER — LACTULOSE 10 GM/15ML PO SOLN
10.0000 g | Freq: Two times a day (BID) | ORAL | Status: DC | PRN
Start: 2017-09-27 — End: 2017-09-28
  Administered 2017-09-27: 10 g via ORAL
  Filled 2017-09-27: qty 30

## 2017-09-27 NOTE — Progress Notes (Signed)
Pt has discharge order today after BM. Pt met PT goals. Pt has received dulcolax suppository, fleets enema, and lactulose and has not had a BM thus far. Pt states he is not passing gas and has no urge, he does have bowel sounds. Dr. Marry Guan notified, received order for mag citrate. If pt does not have BM today, pt will stay overnight per Dr. Marry Guan. Pt and his wife updated.   [name], Steven Bridges

## 2017-09-27 NOTE — Progress Notes (Signed)
Chaplain followed up with Pt with prayer and listening. Pt was alert and moving freely.    09/27/17 1800  Clinical Encounter Type  Visited With Patient and family together  Visit Type Spiritual support  Referral From Nurse  Spiritual Encounters  Spiritual Needs Prayer

## 2017-09-27 NOTE — Progress Notes (Signed)
   Subjective: 2 Days Post-Op Procedure(s) (LRB): COMPUTER ASSISTED TOTAL KNEE ARTHROPLASTY (Right) Patient reports pain as mild.   Patient is well, and has had no acute complaints or problems Pt did very well with therapy yesterday. Ambulated greater than 200 ft. Plan is to go Home after hospital stay. no nausea Patient denies any chest pains or shortness of breath. Objective: Vital signs in last 24 hours: Temp:  [97.6 F (36.4 C)-98 F (36.7 C)] 97.7 F (36.5 C) (04/23 2306) Pulse Rate:  [55-65] 65 (04/23 2306) Resp:  [18-20] 20 (04/23 2306) BP: (103-124)/(52-74) 120/69 (04/23 2306) SpO2:  [95 %-100 %] 96 % (04/23 2306) well approximated incision Heels are non tender and elevated off the bed using rolled towels Intake/Output from previous day: 04/23 0701 - 04/24 0700 In: 2273.3 [P.O.:840; I.V.:1433.3] Out: 4330 [Urine:3800; Drains:530] Intake/Output this shift: Total I/O In: -  Out: 2140 [Urine:2050; Drains:90]  Recent Labs    09/26/17 0454 09/27/17 0345  HGB 11.3* 11.3*   Recent Labs    09/26/17 0454 09/27/17 0345  WBC 9.3 6.6  RBC 3.17* 3.23*  HCT 32.3* 32.9*  PLT 183 182   Recent Labs    09/26/17 0454 09/27/17 0345  NA 129* 136  K 4.4 4.3  CL 98* 104  CO2 26 30  BUN 13 14  CREATININE 0.83 0.88  GLUCOSE 115* 105*  CALCIUM 8.3* 8.3*   No results for input(s): LABPT, INR in the last 72 hours.  EXAM General - Patient is Alert, Appropriate and Oriented Extremity - Neurologically intact Neurovascular intact Sensation intact distally Intact pulses distally Dorsiflexion/Plantar flexion intact No cellulitis present Compartment soft Dressing - dressing C/D/I Motor Function - intact, moving foot and toes well on exam.    Past Medical History:  Diagnosis Date  . Anxiety   . BPH (benign prostatic hyperplasia)   . Colon polyps   . Coronary artery disease   . Depression   . Diverticulosis   . GERD (gastroesophageal reflux disease)   . HBP (high  blood pressure)   . Heart trouble   . Hepatitis   . 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 High cholesterol   . Inguinal hernia   . Osteoarthritis   . Peripheral neuropathy   . Prostate disorder     Assessment/Plan: 2 Days Post-Op Procedure(s) (LRB): COMPUTER ASSISTED TOTAL KNEE ARTHROPLASTY (Right) Active Problems:   S/P total knee arthroplasty  Estimated body mass index is 23.59 kg/m as calculated from the following:   Height as of this encounter: 5\' 10"  (1.778 m).   Weight as of this encounter: 74.6 kg (164 lb 6.4 oz). Up with therapy Discharge home with home health  Labs: wnl DVT Prophylaxis - Lovenox, Foot Pumps and TED hose Weight-Bearing as tolerated to right leg Pt needs bowel movement hemovac discontinued today. End of the drain appeared to be intact. Pt may be discharged to home after she has a bowel movement, does the lap around the station and does steps Please change dressing prior to d/c and give the pt 2 extra honeycomb dressing to take home.  Jillyn Ledger. [place] 09/27/2017, 6:53 AM

## 2017-09-27 NOTE — Progress Notes (Signed)
Physical Therapy Treatment Patient Details Name: Steven Bridges [facility] Sr. MRN: [id number] DOB: [birth year] Today's Date: 09/27/2017    History of Present Illness Patient is an 83 year old male admitted from home s/p R TKA.  PMH includes peripheral neuropathy, OA, inguinal hernia, Htn, GERD, depression, CAD, BPH and anxiety.    PT Comments    Pt awaiting BM for discharge.  Pt ambulated around unit x 1 to stimulate bowl function.  Pt with no c/o dizziness this session and overall stated he is feeling better.    Follow Up Recommendations  Home health PT     Equipment Recommendations  Rolling walker with 5" wheels    Recommendations for Other Services       Precautions / Restrictions Precautions Precautions: Fall Restrictions Weight Bearing Restrictions: Yes RLE Weight Bearing: Weight bearing as tolerated    Mobility  Bed Mobility Overal bed mobility: Modified Independent                Transfers Overall transfer level: Modified independent                  Ambulation/Gait Ambulation/Gait assistance: Supervision Ambulation Distance (Feet): 200 Feet Assistive device: Rolling walker (2 wheeled) Gait Pattern/deviations: Step-through pattern;Decreased step length - right;Decreased step length - left Gait velocity: decreased       Stairs             Wheelchair Mobility    Modified Rankin (Stroke Patients Only)       Balance Overall balance assessment: Modified Independent                                          Cognition Arousal/Alertness: Awake/alert Behavior During Therapy: WFL for tasks assessed/performed Overall Cognitive Status: Within Functional Limits for tasks assessed                                        Exercises      General Comments        Pertinent Vitals/Pain Pain Assessment: 0-10 Pain Score: 1  Pain Location: R knee Pain Descriptors / Indicators: Discomfort Pain Intervention(s):  Limited activity within patient's tolerance;Ice applied    Home Living                      Prior Function            PT Goals (current goals can now be found in the care plan section) Progress towards PT goals: Progressing toward goals    Frequency    BID      PT Plan Current plan remains appropriate    Co-evaluation              AM-PAC PT "6 Clicks" Daily Activity  Outcome Measure  Difficulty turning over in bed (including adjusting bedclothes, sheets and blankets)?: None Difficulty moving from lying on back to sitting on the side of the bed? : None Difficulty sitting down on and standing up from a chair with arms (e.g., wheelchair, bedside commode, etc,.)?: None Help needed moving to and from a bed to chair (including a wheelchair)?: None Help needed walking in hospital room?: A Little Help needed climbing 3-5 steps with a railing? : A Little 6 Click Score: 22    End of Session  Equipment Utilized During Treatment: Gait belt Activity Tolerance: Patient tolerated treatment well Patient left: in bed;with bed alarm set;with call bell/phone within reach;with family/visitor present;with nursing/sitter in room   Pain - Right/Left: Right Pain - part of body: Knee     Time: 2897-9150 PT Time Calculation (min) (ACUTE ONLY): 10 min  Charges:  $Gait Training: 8-22 mins                    G Codes:       Chesley Noon, PTA 09/27/17, 3:47 PM

## 2017-09-27 NOTE — Care Management Note (Signed)
Case Management Note  Patient Details  Name: Steven Bridges [facility] Sr. MRN: [id number] Date of Birth: November 20, 1934  Subjective/Objective:   Discharging today                 Action/Plan: [facility] notified of discharge. Cost of Lovenox is $ 30.00, Patient updated . Walker delivered.   Expected Discharge Date:  09/27/17               Expected Discharge Plan:  [place]  In-House Referral:     Discharge planning Services  CM Consult  Post Acute Care Choice:  Durable Medical Equipment, Home Health Choice offered to:     DME Arranged:  Walker rolling DME Agency:  [place]:  PT [facility] Agency:  [facility] ([facility]  Status of Service:  Completed, signed off  If discussed at [employer] of Stay Meetings, dates discussed:    Additional Comments:  Jolly Mango, RN 09/27/2017, 9:11 AM

## 2017-09-27 NOTE — Progress Notes (Signed)
Physical Therapy Treatment Patient Details Name: Steven [facility] Sr. MRN: [id number] DOB: [birth year] Today's Date: 09/27/2017    History of Present Illness Patient is an 83 year old male admitted from home s/p R TKA.  PMH includes peripheral neuropathy, OA, inguinal hernia, Htn, GERD, depression, CAD, BPH and anxiety.    PT Comments    Split session -  8:35-8:55 Participated in exercises as described below.  Bed mobility with ease.  Stood and ambulated to door in room before pt stated he was feeling dizzy this morning.  Rated 9/10.  Returned to chair and vitals taken.  All within acceptable ranges.  See flow sheets. LE's elevated and pt given time to rest.  9:26-9:44 Returned and pt stated that he was feeling better.  2/10 dizziness.  He was able to ambulate to/from rehab gym for stair training.  Pt has 1-2 steps into home with no rails.  Pt had a difficult time describing but it seems to be more of one step with a threshold.  Educated on navigating and was encouraged to have a railing installed for general safety.  Pt seemed comfortable with plan.  He was given a seated rest then continued to ambulate around unit.  He did c/o progressively increased dizziness increasing to 5/10.  Pt attributed dizziness to medications.  Discussed with primary nurse.  Educated on safety at home and if dizziness occurs to return to sitting.   Follow Up Recommendations  Home health PT     Equipment Recommendations  Rolling walker with 5" wheels    Recommendations for Other Services       Precautions / Restrictions Precautions Precautions: Fall Restrictions Weight Bearing Restrictions: Yes RLE Weight Bearing: Weight bearing as tolerated    Mobility  Bed Mobility Overal bed mobility: Modified Independent                Transfers Overall transfer level: Needs assistance Equipment used: Rolling walker (2 wheeled) Transfers: Sit to/from Stand Sit to Stand: Supervision             Ambulation/Gait Ambulation/Gait assistance: Min guard Ambulation Distance (Feet): 200 Feet Assistive device: Rolling walker (2 wheeled) Gait Pattern/deviations: Step-through pattern;Decreased step length - right;Decreased step length - left Gait velocity: decreased       Stairs Stairs: Yes Stairs assistance: Min guard Stair Management: Two rails Number of Stairs: 4     Wheelchair Mobility    Modified Rankin (Stroke Patients Only)       Balance Overall balance assessment: Modified Independent                                          Cognition Arousal/Alertness: Awake/alert Behavior During Therapy: WFL for tasks assessed/performed Overall Cognitive Status: Within Functional Limits for tasks assessed                                        Exercises Total Joint Exercises Ankle Circles/Pumps: 20 reps;AROM;Both;Supine Quad Sets: Strengthening;15 reps;Supine Gluteal Sets: Strengthening;Both;10 reps;Seated Heel Slides: Right;10 reps;AROM;Supine Hip ABduction/ADduction: Strengthening;Right;10 reps;Supine Straight Leg Raises: 10 reps;Strengthening;Right;Supine Long Arc Quad: AROM;Right;10 reps;Seated Knee Flexion: AAROM;5 reps;Seated;Right Goniometric ROM: 0-104    General Comments        Pertinent Vitals/Pain Pain Assessment: 0-10 Pain Score: 2  Pain Location: R knee Pain Descriptors /  Indicators: Discomfort Pain Intervention(s): Limited activity within patient's tolerance;Premedicated before session;Monitored during session;Ice applied    Home Living                      Prior Function            PT Goals (current goals can now be found in the care plan section) Progress towards PT goals: Progressing toward goals    Frequency    BID      PT Plan Current plan remains appropriate    Co-evaluation              AM-PAC PT "6 Clicks" Daily Activity  Outcome Measure  Difficulty turning over in bed  (including adjusting bedclothes, sheets and blankets)?: None Difficulty moving from lying on back to sitting on the side of the bed? : None Difficulty sitting down on and standing up from a chair with arms (e.g., wheelchair, bedside commode, etc,.)?: A Little Help needed moving to and from a bed to chair (including a wheelchair)?: A Little Help needed walking in hospital room?: A Little Help needed climbing 3-5 steps with a railing? : A Little 6 Click Score: 20    End of Session Equipment Utilized During Treatment: Gait belt Activity Tolerance: Patient tolerated treatment well Patient left: in chair;with chair alarm set;with call bell/phone within reach Nurse Communication: Other (comment) Pain - Right/Left: Right Pain - part of body: Knee     Time: 0938-1829 PT Time Calculation (min) (ACUTE ONLY): 38 min  Charges:  $Gait Training: 23-37 mins $Therapeutic Exercise: 8-22 mins                    G Codes:       Steven Bridges, PTA 09/27/17, 10:40 AM

## 2017-09-28 NOTE — Progress Notes (Addendum)
Pt discharged to home via wheelchair without incident accompanied by wife. Prior to d/c, discharge teachings done both written and verbal with patient and wife. All questions answered. Pt discharged with prescriptions for oxycodone, tramadol, and lovenox. Pt states he has administered his Lovenox with an RN observing x 2 without incident. Pt states his dressing was changed yesterday and he was given 2 extra honeycomb dressings for home. Pt had a BM this AM. Upon discharge, pt rates pain on 3 on scale of 1-10. No change in pt from AM assessment.

## 2017-09-28 NOTE — Progress Notes (Signed)
ORTHOPAEDICS PROGRESS NOTE  PATIENT NAME: Steven BROUILLET Sr. DOB: November 04, 1934  MRN: [id number]  POD # 3: Right total knee arthroplasty  Subjective: The patient rested well last night.  He had 2 bowel movements.  No GI discomfort.  No nausea or vomiting.  Objective: Vital signs in last 24 hours: Temp:  [98.4 F (36.9 C)] 98.4 F (36.9 C) (04/24 2345) Pulse Rate:  [72-83] 72 (04/24 2345) Resp:  [19-20] 19 (04/24 2345) BP: (135-152)/(64-70) 135/64 (04/24 2345) SpO2:  [95 %-99 %] 95 % (04/24 2345)  Intake/Output from previous day: 04/24 0701 - 04/25 0700 In: 840 [P.O.:840] Out: 500 [Urine:500]  Recent Labs    09/26/17 0454 09/27/17 0345  WBC 9.3 6.6  HGB 11.3* 11.3*  HCT 32.3* 32.9*  PLT 183 182  K 4.4 4.3  CL 98* 104  CO2 26 30  BUN 13 14  CREATININE 0.83 0.88  GLUCOSE 115* 105*  CALCIUM 8.3* 8.3*    EXAM General: Well-developed well-nourished male seen in no apparent discomfort. Lungs: clear to auscultation Cardiac: normal rate and regular rhythm Right lower extremity: Dressing is dry and intact.  No erythema.  Good quadriceps tone.  Homans test is negative. Neurologic: Awake, alert, and oriented.  Sensory and motor function are intact.  Assessment: Right total knee arthroplasty  Secondary diagnoses: Peripheral neuropathy Hypercholesterolemia Hypertension Gastroesophageal reflux disease Diverticulosis Depression Coronary artery disease Benign prostatic hypertrophy  Plan: Notes from physical therapy were reviewed.  The patient has met criteria for discharge to home. Plan is to go Home with home physical therapy.Marland Kitchen DVT Prophylaxis - Lovenox and TED hose  James P. Holley Bouche M.D.

## 2017-09-28 NOTE — Care Management Important Message (Signed)
Important Message  Patient Details  Name: Kasch Borquez [facility] Sr. MRN: [id number] Date of Birth: 20-Sep-1934   Medicare Important Message Given:  Yes    Juliann Pulse A Wheeler Incor[place]ia 09/28/2017, 10:00 AM

## 2017-09-28 NOTE — Progress Notes (Signed)
Physical Therapy Treatment Patient Details Name: Steven Bridges [facility] Sr. MRN: [id number] DOB: 31-Aug-1934 Today's Date: 09/28/2017    History of Present Illness Patient is an 83 year old male admitted from home s/p R TKA.  PMH includes peripheral neuropathy, OA, inguinal hernia, Htn, GERD, depression, CAD, BPH and anxiety.    PT Comments    Pt transitioned out of bed and was able to ambulate around unit and stairs with ease.  Pt feeling much better this am singing though out session.  No dizziness note today.  Awaiting discharge home.  Follow Up Recommendations  Home health PT     Equipment Recommendations  Rolling walker with 5" wheels    Recommendations for Other Services       Precautions / Restrictions Precautions Precautions: Fall Restrictions Weight Bearing Restrictions: Yes RLE Weight Bearing: Weight bearing as tolerated    Mobility  Bed Mobility Overal bed mobility: Modified Independent                Transfers Overall transfer level: Modified independent                  Ambulation/Gait Ambulation/Gait assistance: Modified independent (Device/Increase time) Ambulation Distance (Feet): 200 Feet Assistive device: Rolling walker (2 wheeled) Gait Pattern/deviations: Step-through pattern;Decreased step length - right;Decreased step length - left Gait velocity: decreased       Stairs Stairs: Yes Stairs assistance: Supervision Stair Management: Two rails Number of Stairs: 4 General stair comments: with ease    Wheelchair Mobility    Modified Rankin (Stroke Patients Only)       Balance Overall balance assessment: Modified Independent                                          Cognition Arousal/Alertness: Awake/alert Behavior During Therapy: WFL for tasks assessed/performed Overall Cognitive Status: Within Functional Limits for tasks assessed                                        Exercises Total  Joint Exercises Long Arc Quad: AROM;Right;Seated;10 reps Knee Flexion: AAROM;5 reps;Seated;Right Goniometric ROM: 0-104 Other Exercises Other Exercises: to bathroom for small loose BM    General Comments        Pertinent Vitals/Pain Pain Assessment: 0-10 Pain Score: 2  Pain Location: R knee Pain Descriptors / Indicators: Discomfort Pain Intervention(s): Limited activity within patient's tolerance;Monitored during session;Ice applied    Home Living                      Prior Function            PT Goals (current goals can now be found in the care plan section) Progress towards PT goals: Progressing toward goals    Frequency    BID      PT Plan Current plan remains appropriate    Co-evaluation              AM-PAC PT "6 Clicks" Daily Activity  Outcome Measure  Difficulty turning over in bed (including adjusting bedclothes, sheets and blankets)?: None Difficulty moving from lying on back to sitting on the side of the bed? : None Difficulty sitting down on and standing up from a chair with arms (e.g., wheelchair, bedside commode, etc,.)?: None Help needed moving  to and from a bed to chair (including a wheelchair)?: None Help needed walking in hospital room?: None Help needed climbing 3-5 steps with a railing? : A Little 6 Click Score: 23    End of Session Equipment Utilized During Treatment: Gait belt Activity Tolerance: Patient tolerated treatment well Patient left: in chair;with chair alarm set;with call bell/phone within reach;with family/visitor present Nurse Communication: Other (comment) Pain - Right/Left: Right Pain - part of body: Knee     Time: 2811-8867 PT Time Calculation (min) (ACUTE ONLY): 18 min  Charges:  $Gait Training: 8-22 mins                    G Codes:       Chesley Noon, PTA 09/28/17, 9:21 AM

## 2017-10-04 IMAGING — CT CT HEAD W/O CM
3 series · 16 of 47 positions shown, 19 images · non-contrast
Comparison: None.

CLINICAL DATA: Double vision over the last 5 weeks, anxiety

EXAM:
CT HEAD WITHOUT CONTRAST
TECHNIQUE: Contiguous axial images were obtained from the base of the skull
through the vertex without intravenous contrast.

[Series 2: head wo · axial · 0.41mm/px · z∈[-40,+85]mm · 10 of 30 slices shown, 13 images]
[im 3/30  brain]
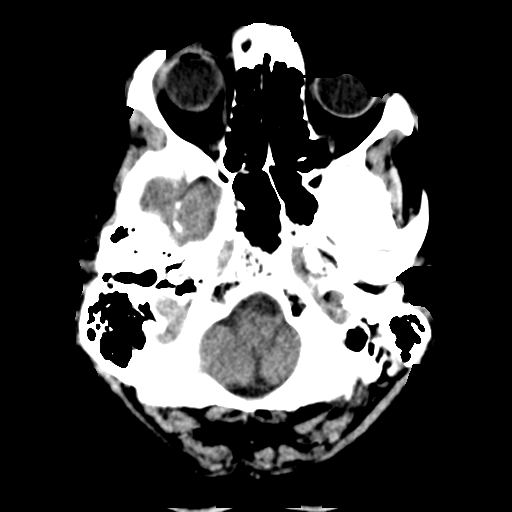
[im 3/30  bone]
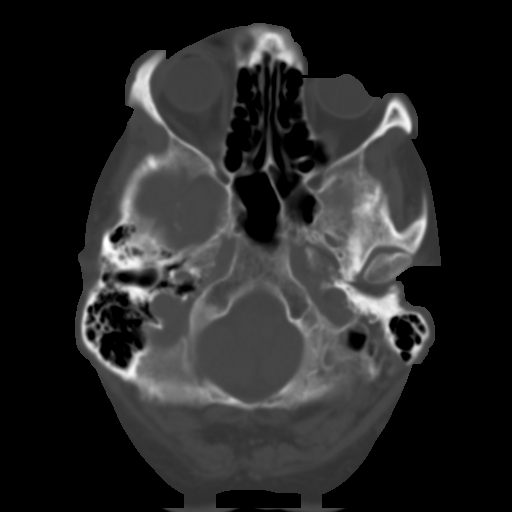
[im 6/30  brain]
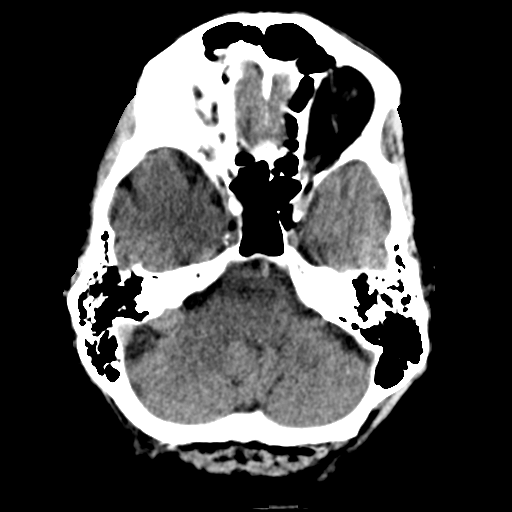
[im 9/30  brain]
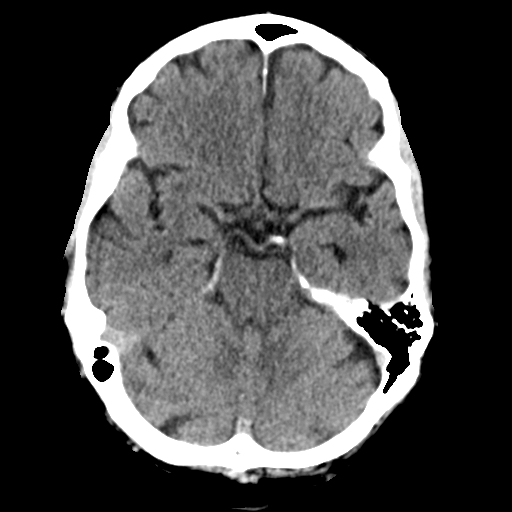
[im 11/30  brain]
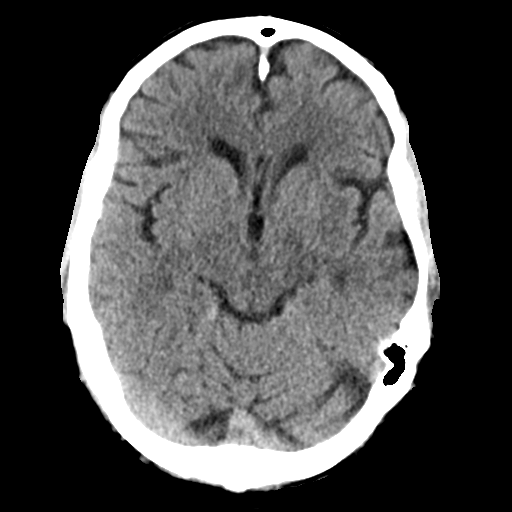
[im 14/30  brain]
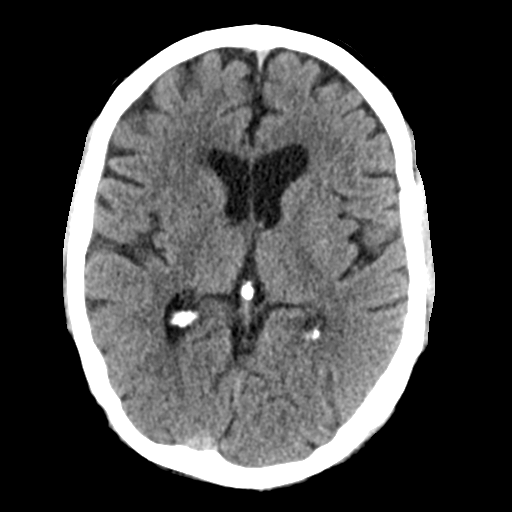
[im 14/30  bone]
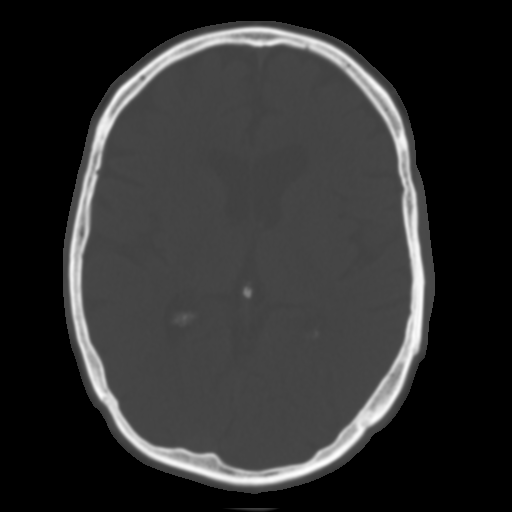
[im 17/30  brain]
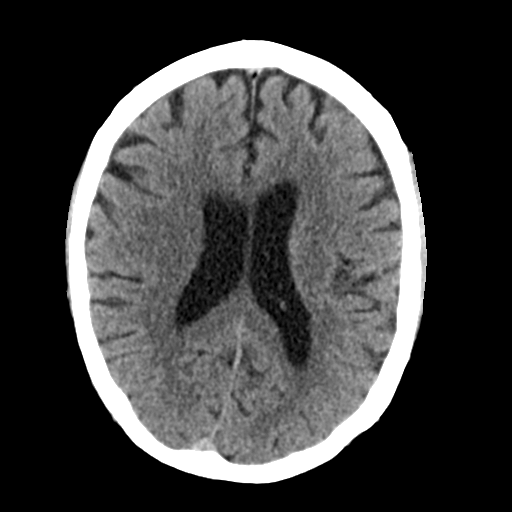
[im 20/30  brain]
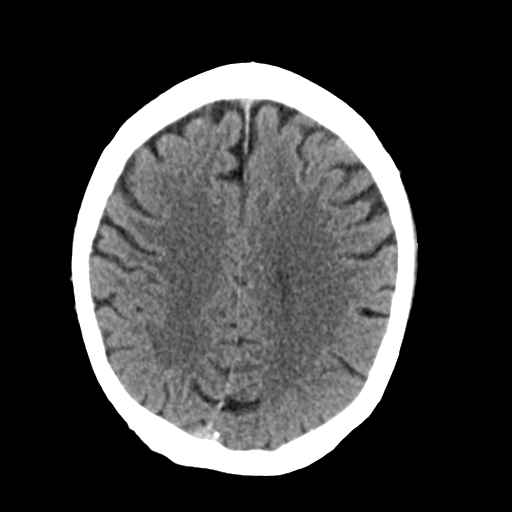
[im 23/30  brain]
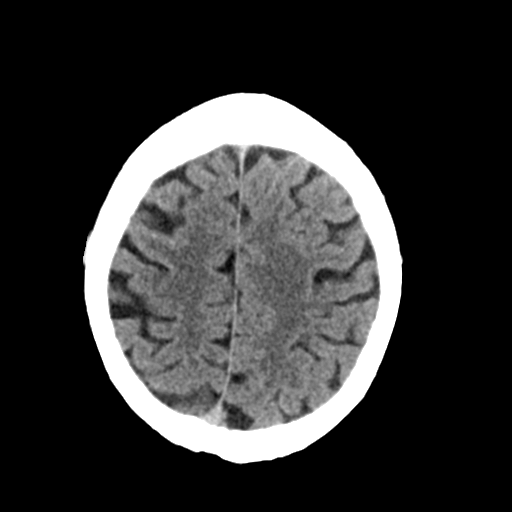
[im 25/30  brain]
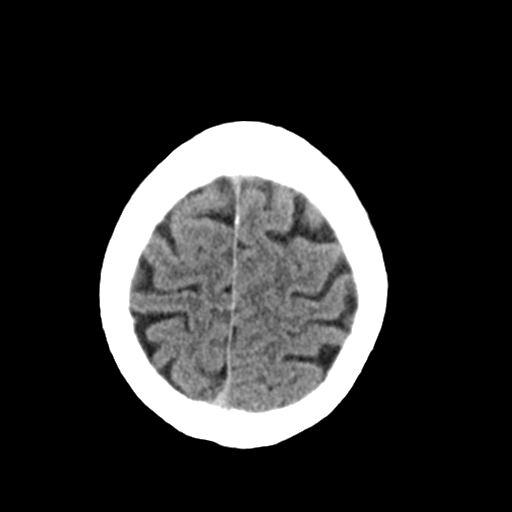
[im 25/30  bone]
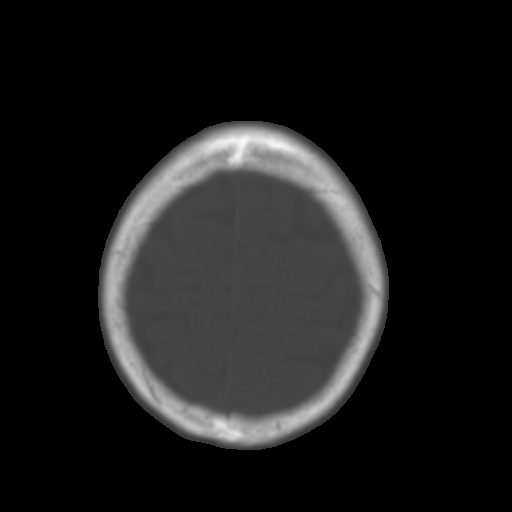
[im 28/30  brain]
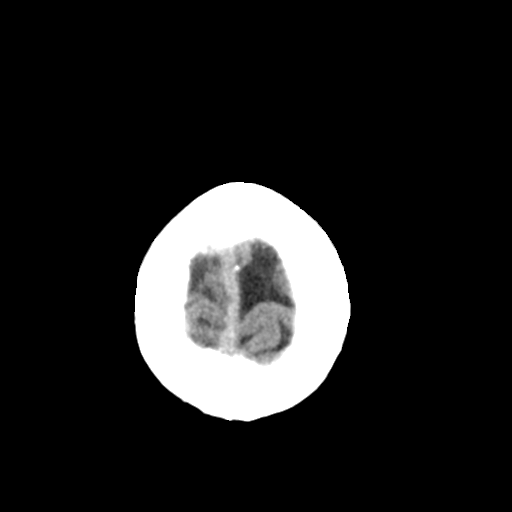

[Series 4: coronal soft tissue · coronal · 0.32mm/px · 3 of 65 slices shown]
[im 22/65  brain]
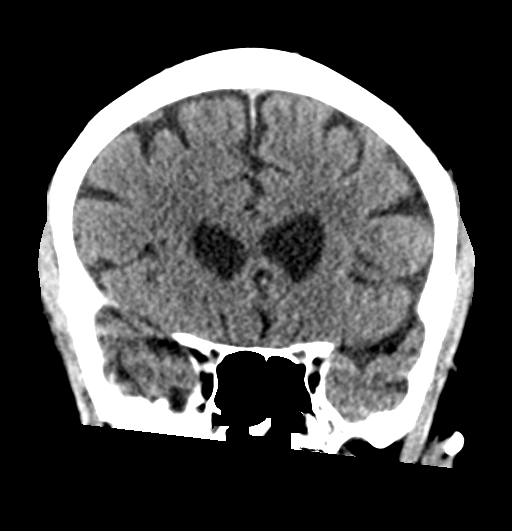
[im 29/65  brain]
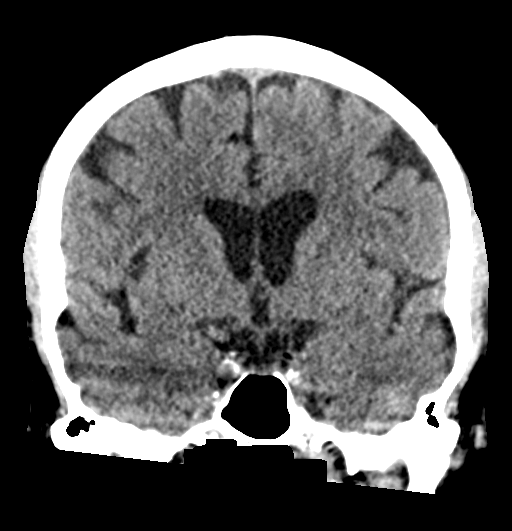
[im 36/65  brain]
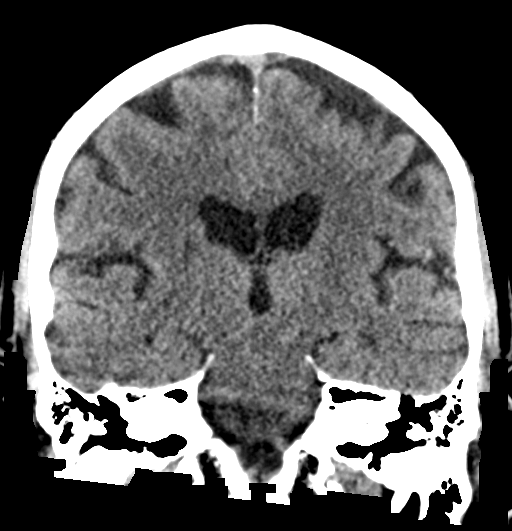

[Series 5: sagittal soft tissue · sagittal · 0.33mm/px · 3 of 55 slices shown]
[im 19/55  brain]
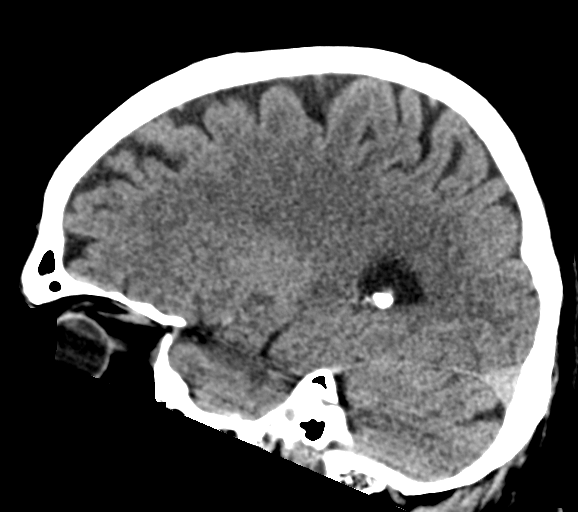
[im 28/55  brain]
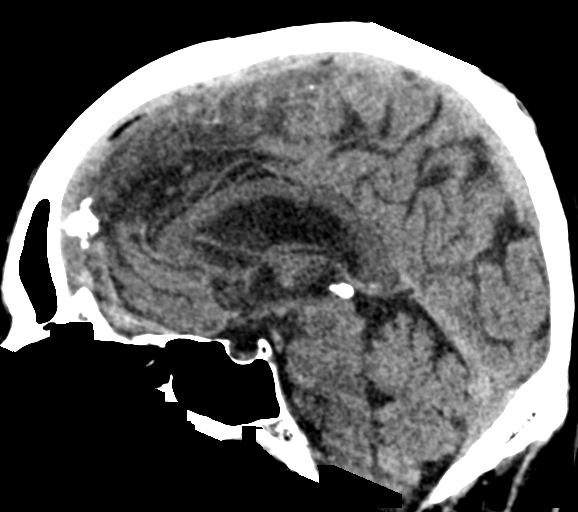
[im 37/55  brain]
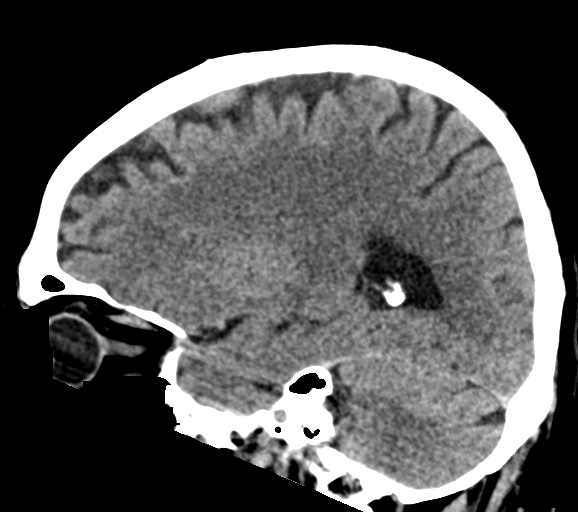

[16 of 47 positions shown; findings below may reference images not displayed]

FINDINGS: Brain: The ventricular system is within normal limits in size and
configuration for age with only mild cortical atrophy present. No
hemorrhage, mass lesion, or acute infarction is seen. The septum is
midline in position. The fourth ventricle and basilar cisterns are
unremarkable.

Vascular: No vascular abnormality is seen on this unenhanced study.

Skull: No calvarial abnormality is seen.

Sinuses/Orbits: The paranasal sinuses are clear.

Other: None
IMPRESSION: 1. Mild atrophy.
2. No acute intracranial abnormality.

## 2018-05-23 ENCOUNTER — Ambulatory Visit: Payer: Self-pay | Admitting: Urology

## 2018-06-29 ENCOUNTER — Encounter: Payer: Self-pay | Admitting: Urology

## 2018-06-29 ENCOUNTER — Ambulatory Visit (INDEPENDENT_AMBULATORY_CARE_PROVIDER_SITE_OTHER): Payer: Medicare Other | Admitting: Urology

## 2018-06-29 VITALS — BP 120/64 | HR 70 | Ht 70.0 in | Wt 187.0 lb

## 2018-06-29 DIAGNOSIS — N401 Enlarged prostate with lower urinary tract symptoms: Secondary | ICD-10-CM | POA: Diagnosis not present

## 2018-06-29 NOTE — Patient Instructions (Signed)

## 2018-06-29 NOTE — Progress Notes (Signed)
06/29/2018  3:57 PM   Steven Mann Sr. February 25, 1935 [id number]  Referring provider: [name], MD [place] [facility] [place], [place] [address]  Chief Complaint  Patient presents with  . Establish Care   Urologic History 1. BPH with LUTS  - Symptoms well managed on tamsulosin  2. History of Elevated PSA  - Benign Prostate biopsy in 03/2008  - PSA returned to normal shortly afterward HPI: Steven Bridges [facility] Sr. is a 84 y.o. White or Caucasian male that presents today to establish care for his BPH with LUTS. He was previously followed by Dr. Jacqlyn Larsen.  He is currently on tamsulosin. Patient reports his symptoms are stable at this time. Denies dysuria, gross hematuria or flank/abdominal/pelvic/scrotal pain.  Prostate biopsy in 03/2008 following elevated PSA. PSA returned to normal afterwards.  PMH: Past Medical History:  Diagnosis Date  . Anxiety   . BPH (benign prostatic hyperplasia)   . Colon polyps   . Coronary artery disease   . Depression   . Diverticulosis   . GERD (gastroesophageal reflux disease)   . HBP (high blood pressure)   . Heart trouble   . Hepatitis   . 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 High cholesterol   . Inguinal hernia   . Osteoarthritis   . Peripheral neuropathy   . Prostate disorder     Surgical History: Past Surgical History:  Procedure Laterality Date  . CARDIAC CATHETERIZATION  01/2000   2 stents LAD  . COLONOSCOPY    . CORONARY ANGIOPLASTY    . HEART STENTS    . HERNIA REPAIR     X 4; bilateral groin and umbilical  . KNEE ARTHROPLASTY Right 09/25/2017   Procedure: COMPUTER ASSISTED TOTAL KNEE ARTHROPLASTY;  Surgeon: [name], MD;  Location: [facility] ORS;  Service: Orthopedics;  Laterality: Right;  . LEFT HAND  1961   hand crushed from industrial accident  . PICC LINE INSERTION  2009  . PICC LINE REMOVAL ([place] HX)  2009  . PROSTATE BIOPSY    . TONSILLECTOMY    . WISDOM  TOOTH EXTRACTION      Home Medications:  Allergies as of 06/29/2018      Reactions   Gabapentin Other (See Comments)   Leg edema   Lamotrigine Rash      Medication List       Accurate as of June 29, 2018  3:57 PM. Always use your most recent med list.        atorvastatin 10 MG tablet Commonly known as:  LIPITOR Take 10 mg by mouth daily.   captopril 25 MG tablet Commonly known as:  CAPOTEN Take 25 mg by mouth 2 (two) times daily.   CENTRUM SILVER PO Take 1 tablet by mouth daily.   enoxaparin 40 MG/0.4ML injection Commonly known as:  LOVENOX Inject 0.4 mLs (40 mg total) into the skin daily for 14 days.   fexofenadine 180 MG tablet Commonly known as:  ALLEGRA Take 180 mg by mouth daily as needed for allergies or rhinitis.   fluticasone 50 MCG/ACT nasal spray Commonly known as:  FLONASE   hydrochlorothiazide 25 MG tablet Commonly known as:  HYDRODIURIL   Melatonin 5 MG Tabs Take 5 mg by mouth at bedtime as needed (sleep).   nitroGLYCERIN 0.4 MG SL tablet Commonly known as:  NITROSTAT Place 0.4 mg under the tongue every 5 (five) minutes as needed for chest pain.  sertraline 50 MG tablet Commonly known as:  ZOLOFT Take 50 mg by mouth daily.   tamsulosin 0.4 MG Caps capsule Commonly known as:  FLOMAX Take 0.4 mg by mouth 2 (two) times daily.   traMADol 50 MG tablet Commonly known as:  ULTRAM Take 1-2 tablets (50-100 mg total) by mouth every 4 (four) hours as needed for moderate pain.   triamcinolone 0.025 % cream Commonly known as:  KENALOG Apply 1 application topically daily as needed for rash. Mixed with eucerin   Verapamil HCl CR 200 MG Cp24 Take 200 mg by mouth daily.       Allergies:  Allergies  Allergen Reactions  . Gabapentin Other (See Comments)    Leg edema   . Lamotrigine Rash    Family History: Family History  Problem Relation Age of Onset  . Lung cancer Mother     Social History:  reports that he quit smoking about 5  years ago. His smoking use included cigars and cigarettes. He quit smokeless tobacco use about 30 years ago. He reports current alcohol use. He reports that he does not use drugs.  ROS: UROLOGY Frequent Urination?: No Hard to postpone urination?: No Burning/pain with urination?: No Get up at night to urinate?: No Leakage of urine?: No Urine stream starts and stops?: No Trouble starting stream?: No Do you have to strain to urinate?: No Blood in urine?: No Urinary tract infection?: No Sexually transmitted disease?: No Injury to kidneys or bladder?: No Painful intercourse?: No Weak stream?: No Erection problems?: Yes Penile pain?: No  Gastrointestinal Nausea?: No Vomiting?: No Indigestion/heartburn?: No Diarrhea?: Yes Constipation?: No  Constitutional Fever: No Night sweats?: No Weight loss?: No Fatigue?: No  Skin Skin rash/lesions?: No Itching?: No  Eyes Blurred vision?: Yes Double vision?: No  Ears/Nose/Throat Sore throat?: No Sinus problems?: Yes  Hematologic/Lymphatic Swollen glands?: No Easy bruising?: Yes  Cardiovascular Leg swelling?: No Chest pain?: No  Respiratory Cough?: No Shortness of breath?: No  Endocrine Excessive thirst?: No  Musculoskeletal Back pain?: No Joint pain?: Yes  Neurological Headaches?: No Dizziness?: No  Psychologic Depression?: No Anxiety?: No  Physical Exam: BP 120/64 (BP Location: Left Arm, Patient Position: Sitting, Cuff Size: Normal)   Pulse 70   Ht 5\' 10"  (1.778 m)   Wt 187 lb (84.8 kg)   BMI 26.83 kg/m   Constitutional:  Alert and oriented, No acute distress. Respiratory: Normal respiratory effort, no increased work of breathing. Head: Normocephalic and Atraumatic. GU: No CVA tenderness Skin: No rashes, bruises or suspicious lesions. Neurologic: Grossly intact, no focal deficits, moving all 4 extremities. Psychiatric: Normal mood and affect.  Assessment & Plan:    1. BPH with LUTS  - Patients  symptoms are well managed on tamsulosin  - Rx for tamsulosin #90 sent to pharmacy  - Discussed the AUA PSA Screening Guideline's for prostate cancer   Return in about 1 year (around 06/30/2019) for follow up for BPH .   [name], MD [place] [address], [place], [place] [address] [phone]  I, [phone] Renne Crigler , am acting as a scribe for [name], MD  I, [name], MD, have reviewed all documentation for this visit. The documentation on 06/29/18 for the exam, diagnosis, procedures, and orders are all accurate and complete.

## 2018-07-02 ENCOUNTER — Encounter: Payer: Self-pay | Admitting: Urology

## 2018-07-02 MED ORDER — TAMSULOSIN HCL 0.4 MG PO CAPS: 0.4000 mg | ORAL_CAPSULE | Freq: Two times a day (BID) | ORAL | 3 refills | Status: DC

## 2019-03-06 ENCOUNTER — Ambulatory Visit (INDEPENDENT_AMBULATORY_CARE_PROVIDER_SITE_OTHER): Payer: Medicare Other

## 2019-03-06 ENCOUNTER — Encounter: Payer: Self-pay | Admitting: Podiatry

## 2019-03-06 ENCOUNTER — Other Ambulatory Visit: Payer: Self-pay

## 2019-03-06 ENCOUNTER — Ambulatory Visit (INDEPENDENT_AMBULATORY_CARE_PROVIDER_SITE_OTHER): Payer: Medicare Other | Admitting: Podiatry

## 2019-03-06 VITALS — BP 159/77 | HR 63 | Resp 16

## 2019-03-06 DIAGNOSIS — G5792 Unspecified mononeuropathy of left lower limb: Secondary | ICD-10-CM

## 2019-03-06 DIAGNOSIS — M779 Enthesopathy, unspecified: Secondary | ICD-10-CM | POA: Diagnosis not present

## 2019-03-06 DIAGNOSIS — G5762 Lesion of plantar nerve, left lower limb: Secondary | ICD-10-CM | POA: Diagnosis not present

## 2019-03-06 DIAGNOSIS — G5782 Other specified mononeuropathies of left lower limb: Secondary | ICD-10-CM

## 2019-03-06 DIAGNOSIS — M778 Other enthesopathies, not elsewhere classified: Secondary | ICD-10-CM

## 2019-03-06 MED ORDER — TRAMADOL HCL 50 MG PO TABS: 50.0000 mg | ORAL_TABLET | Freq: Three times a day (TID) | ORAL | 0 refills | Status: AC | PRN

## 2019-03-06 NOTE — Progress Notes (Signed)
Subjective:  Patient ID: Steven Mann Sr., male    DOB: [birth year],  MRN: [id number] HPI Chief Complaint  Patient presents with  . Foot Pain    Forefoot and 4th toe left - sharp, shooting pains from forefoot to 4th toe x 1 week, "keep getting these zingers", can't sleep at night, has taken multiple sleep aids, some numbness plantarly  . New Patient (Initial Visit)    Est pt 11/2015    84 y.o. male presents with the above complaint.   ROS: Denies fever chills nausea vomiting muscle aches pains calf pain back pain chest pain shortness of breath.  Past Medical History:  Diagnosis Date  . Anxiety   . BPH (benign prostatic hyperplasia)   . Colon polyps   . Coronary artery disease   . Depression   . Diverticulosis   . GERD (gastroesophageal reflux disease)   . HBP (high blood pressure)   . Heart trouble   . Hepatitis   . 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 High cholesterol   . Inguinal hernia   . Osteoarthritis   . Peripheral neuropathy   . Prostate disorder    Past Surgical History:  Procedure Laterality Date  . CARDIAC CATHETERIZATION  01/2000   2 stents LAD  . COLONOSCOPY    . CORONARY ANGIOPLASTY    . HEART STENTS    . HERNIA REPAIR     X 4; bilateral groin and umbilical  . KNEE ARTHROPLASTY Right 09/25/2017   Procedure: COMPUTER ASSISTED TOTAL KNEE ARTHROPLASTY;  Surgeon: [name], MD;  Location: [facility] ORS;  Service: Orthopedics;  Laterality: Right;  . LEFT HAND  1961   hand crushed from industrial accident  . PICC LINE INSERTION  2009  . PICC LINE REMOVAL ([place] HX)  2009  . PROSTATE BIOPSY    . TONSILLECTOMY    . WISDOM TOOTH EXTRACTION      Current Outpatient Medications:  .  acetaminophen (TYLENOL) 500 MG tablet, Take 500 mg by mouth every 6 (six) hours as needed., Disp: , Rfl:  .  aspirin 81 MG chewable tablet, Chew by mouth daily., Disp: , Rfl:  .  TRAZODONE HCL PO, Take by mouth., Disp: , Rfl:  .  atorvastatin  (LIPITOR) 10 MG tablet, Take 10 mg by mouth daily., Disp: , Rfl:  .  captopril (CAPOTEN) 25 MG tablet, Take 25 mg by mouth 2 (two) times daily., Disp: , Rfl:  .  hydrochlorothiazide (HYDRODIURIL) 25 MG tablet, , Disp: , Rfl:  .  IBU 600 MG tablet, , Disp: , Rfl:  .  Multiple Vitamins-Minerals (CENTRUM SILVER PO), Take 1 tablet by mouth daily. , Disp: , Rfl:  .  nitroGLYCERIN (NITROSTAT) 0.4 MG SL tablet, Place 0.4 mg under the tongue every 5 (five) minutes as needed for chest pain., Disp: , Rfl:  .  sertraline (ZOLOFT) 50 MG tablet, Take 50 mg by mouth daily., Disp: , Rfl:  .  tamsulosin (FLOMAX) 0.4 MG CAPS capsule, Take 1 capsule (0.4 mg total) by mouth 2 (two) times daily., Disp: 180 capsule, Rfl: 3 .  traMADol (ULTRAM) 50 MG tablet, Take 1 tablet (50 mg total) by mouth every 8 (eight) hours as needed., Disp: 30 tablet, Rfl: 0 .  triamcinolone (KENALOG) 0.025 % cream, Apply 1 application topically daily as needed for rash. Mixed with eucerin, Disp: , Rfl:  .  Verapamil HCl CR 200 MG CP24, Take 200 mg by mouth  daily. , Disp: , Rfl:   Allergies  Allergen Reactions  . Gabapentin Other (See Comments)    Leg edema   . Lamotrigine Rash   Review of Systems Objective:   Vitals:   03/06/19 1427  BP: (!) 159/77  Pulse: 63  Resp: 16    General: Well developed, nourished, in no acute distress, alert and oriented x3   Dermatological: Skin is warm, dry and supple bilateral. Nails x 10 are well maintained; remaining integument appears unremarkable at this time. There are no open sores, no preulcerative lesions, no rash or signs of infection present.  Vascular: Dorsalis Pedis artery and Posterior Tibial artery pedal pulses are 2/4 bilateral with immedate capillary fill time. Pedal hair growth present. No varicosities and no lower extremity edema present bilateral.   Neruologic: Grossly intact via light touch bilateral. Vibratory intact via tuning fork bilateral. Protective threshold with Semmes  Wienstein monofilament intact to all pedal sites bilateral. Patellar and Achilles deep tendon reflexes 2+ bilateral. No Babinski or clonus noted bilateral.   Musculoskeletal: No gross boney pedal deformities bilateral. No pain, crepitus, or limitation noted with foot and ankle range of motion bilateral. Muscular strength 5/5 in all groups tested bilateral.  Gait: Unassisted, Nonantalgic.    Radiographs:  Do not demonstrate any type of significant osseous abnormalities.  Assessment & Plan:   Assessment: Neuroma third interdigital space left foot neuropathy bilateral.  Plan: Discussed etiology pathology conservative versus surgical therapies.  Injected 10 mg of Kenalog 5 mg Marcaine point of maximal tenderness third interspace of the left foot.  Wrote another prescription for tramadol.  Follow-up with him in 1 month.     Daevion Navarette T. [place], [place]

## 2019-04-17 ENCOUNTER — Ambulatory Visit: Payer: Medicare Other | Admitting: Podiatry

## 2019-04-19 ENCOUNTER — Ambulatory Visit (INDEPENDENT_AMBULATORY_CARE_PROVIDER_SITE_OTHER): Payer: Medicare Other | Admitting: Podiatry

## 2019-04-19 ENCOUNTER — Other Ambulatory Visit: Payer: Self-pay

## 2019-04-19 ENCOUNTER — Encounter: Payer: Self-pay | Admitting: Podiatry

## 2019-04-19 DIAGNOSIS — G5761 Lesion of plantar nerve, right lower limb: Secondary | ICD-10-CM

## 2019-04-19 DIAGNOSIS — G5782 Other specified mononeuropathies of left lower limb: Secondary | ICD-10-CM

## 2019-04-19 DIAGNOSIS — G5762 Lesion of plantar nerve, left lower limb: Secondary | ICD-10-CM

## 2019-04-19 DIAGNOSIS — G5781 Other specified mononeuropathies of right lower limb: Secondary | ICD-10-CM

## 2019-04-23 NOTE — Progress Notes (Signed)
   HPI: 84 y.o. male presenting today for follow up evaluation of a Morton's neuroma of the bilateral 3rd interspaces. He reports continued intermittent pain of the right foot. He denies any pain at this time and states it is usually worse at night. There are no modifying factors noted. He notes some relief from the injection he received at his last appointment. Patient is here for further evaluation and treatment.    Past Medical History:  Diagnosis Date  . Anxiety   . BPH (benign prostatic hyperplasia)   . Colon polyps   . Coronary artery disease   . Depression   . Diverticulosis   . GERD (gastroesophageal reflux disease)   . HBP (high blood pressure)   . Heart trouble   . Hepatitis   . 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 High cholesterol   . Inguinal hernia   . Osteoarthritis   . Peripheral neuropathy   . Prostate disorder       Physical Exam: General: The patient is alert and oriented x3 in no acute distress.  Dermatology: Skin is warm, dry and supple bilateral lower extremities. Negative for open lesions or macerations.  Vascular: Palpable pedal pulses bilaterally. No edema or erythema noted. Capillary refill within normal limits.  Neurological: Epicritic and protective threshold grossly intact bilaterally.   Musculoskeletal Exam: Sharp pain with palpation of the bilateral 3rd interspace and lateral compression of the metatarsal heads consistent with neuroma.  Positive Conley Canal sign with loadbearing of the forefoot.   Assessment: 1.  Morton's neuroma 3rd interspace bilateral feet   Plan of Care:  1. Patient was evaluated.   2. Injection of 0.5 mLs Celestone Soluspan injected into the 3rd interspace of the bilateral feet.  3. Met pads placed in shoes.  4. Return to clinic in 4 weeks.    Edrick Kins, DPM [facility]  Dr. Edrick Kins, [place]                                         [place], [place] [address]                Office [phone]  Fax [phone]

## 2019-05-22 ENCOUNTER — Encounter: Payer: Self-pay | Admitting: Podiatry

## 2019-05-22 ENCOUNTER — Ambulatory Visit (INDEPENDENT_AMBULATORY_CARE_PROVIDER_SITE_OTHER): Payer: Medicare Other | Admitting: Podiatry

## 2019-05-22 ENCOUNTER — Other Ambulatory Visit: Payer: Self-pay

## 2019-05-22 DIAGNOSIS — G5761 Lesion of plantar nerve, right lower limb: Secondary | ICD-10-CM | POA: Diagnosis not present

## 2019-05-22 DIAGNOSIS — G5781 Other specified mononeuropathies of right lower limb: Secondary | ICD-10-CM

## 2019-05-22 NOTE — Progress Notes (Signed)
He presents today for follow-up of neuroma third interdigital space.  Complaining of some shooting type pains to the tip of the second toe numbness beneath both feet particularly beneath the metatarsal phalangeal joints.  And some shooting pains which appear to be more intense to the third interdigital space area.  Objective: Pulses are palpable.  Palpable Mulder's click third interdigital space right.  Assessment: Chronic neuropathy with neuroma third interdigital space right neuropathies bilateral.  Plan: Reinjected around the second and third interdigital space today with 1 syringe 20 mg Kenalog was distributed between the 2 injection sites.  I will follow-up with him in a month if necessary.

## 2019-06-28 ENCOUNTER — Ambulatory Visit (INDEPENDENT_AMBULATORY_CARE_PROVIDER_SITE_OTHER): Payer: Medicare Other | Admitting: Urology

## 2019-06-28 ENCOUNTER — Encounter: Payer: Self-pay | Admitting: Urology

## 2019-06-28 ENCOUNTER — Other Ambulatory Visit: Payer: Self-pay

## 2019-06-28 VITALS — BP 133/66 | HR 67 | Ht 70.0 in | Wt 178.9 lb

## 2019-06-28 DIAGNOSIS — N401 Enlarged prostate with lower urinary tract symptoms: Secondary | ICD-10-CM | POA: Diagnosis not present

## 2019-06-28 MED ORDER — TAMSULOSIN HCL 0.4 MG PO CAPS: 0.4000 mg | ORAL_CAPSULE | Freq: Every day | ORAL | 3 refills | Status: AC

## 2019-06-28 NOTE — Progress Notes (Signed)
06/28/2019 8:25 AM   Steven Bridges Sr. 07/01/1934 [id number]  Referring provider: [name], MD [facility] [place],  [place] [address]  Chief Complaint  Patient presents with  . Benign Prostatic Hypertrophy    Urologic History 1. BPH with LUTS             - Symptoms well managed on tamsulosin  2. History of Elevated PSA             - Benign Prostate biopsy in 03/2008             - PSA returned to normal shortly afterward  HPI: 85 y.o. male presents for annual follow-up.  His voiding pattern is stable and not bothersome.  He is only taking 0.4 mg of tamsulosin daily.  He does note post void dribbling.  Denies dysuria or gross hematuria.  He does complain of blurred vision, nausea and has an appointment with ophthalmology in the near future.   PMH: Past Medical History:  Diagnosis Date  . Anxiety   . BPH (benign prostatic hyperplasia)   . Colon polyps   . Coronary artery disease   . Depression   . Diverticulosis   . GERD (gastroesophageal reflux disease)   . HBP (high blood pressure)   . Heart trouble   . Hepatitis   . 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 High cholesterol   . Inguinal hernia   . Osteoarthritis   . Peripheral neuropathy   . Prostate disorder     Surgical History: Past Surgical History:  Procedure Laterality Date  . CARDIAC CATHETERIZATION  01/2000   2 stents LAD  . COLONOSCOPY    . CORONARY ANGIOPLASTY    . HEART STENTS    . HERNIA REPAIR     X 4; bilateral groin and umbilical  . KNEE ARTHROPLASTY Right 09/25/2017   Procedure: COMPUTER ASSISTED TOTAL KNEE ARTHROPLASTY;  Surgeon: [name], MD;  Location: [facility] ORS;  Service: Orthopedics;  Laterality: Right;  . LEFT HAND  1961   hand crushed from industrial accident  . PICC LINE INSERTION  2009  . PICC LINE REMOVAL ([place] HX)  2009  . PROSTATE BIOPSY    . TONSILLECTOMY    . WISDOM TOOTH EXTRACTION      Home  Medications:  Allergies as of 06/28/2019      Reactions   Gabapentin Other (See Comments)   Leg edema   Lamotrigine Rash      Medication List       Accurate as of June 28, 2019  8:25 AM. If you have any questions, ask your nurse or doctor.        acetaminophen 500 MG tablet Commonly known as: TYLENOL Take 500 mg by mouth every 6 (six) hours as needed.   ARIPiprazole 2 MG tablet Commonly known as: ABILIFY Take 2 mg by mouth daily.   aspirin 81 MG chewable tablet Chew by mouth daily.   atorvastatin 10 MG tablet Commonly known as: LIPITOR Take 10 mg by mouth daily.   buPROPion 100 MG 12 hr tablet Commonly known as: WELLBUTRIN SR Take 100 mg by mouth every morning.   captopril 25 MG tablet Commonly known as: CAPOTEN Take 25 mg by mouth 2 (two) times daily.   CENTRUM SILVER PO Take 1 tablet by mouth daily.   hydrochlorothiazide 25 MG tablet Commonly known as: HYDRODIURIL   IBU 600  MG tablet Generic drug: ibuprofen   nitroGLYCERIN 0.4 MG SL tablet Commonly known as: NITROSTAT Place 0.4 mg under the tongue every 5 (five) minutes as needed for chest pain.   sertraline 50 MG tablet Commonly known as: ZOLOFT Take 50 mg by mouth daily.   sertraline 100 MG tablet Commonly known as: ZOLOFT Take 100 mg by mouth daily.   tamsulosin 0.4 MG Caps capsule Commonly known as: FLOMAX Take 1 capsule (0.4 mg total) by mouth 2 (two) times daily.   traMADol 50 MG tablet Commonly known as: ULTRAM Take 1 tablet (50 mg total) by mouth every 8 (eight) hours as needed.   TRAZODONE HCL PO Take by mouth.   triamcinolone 0.025 % cream Commonly known as: KENALOG Apply 1 application topically daily as needed for rash. Mixed with eucerin   venlafaxine XR 37.5 MG 24 hr capsule Commonly known as: EFFEXOR-XR Take 37.5 mg by mouth daily.   Verapamil HCl CR 200 MG Cp24 Take 200 mg by mouth daily.       Allergies:  Allergies  Allergen Reactions  . Gabapentin Other (See  Comments)    Leg edema   . Lamotrigine Rash    Family History: Family History  Problem Relation Age of Onset  . Lung cancer Mother     Social History:  reports that he quit smoking about 6 years ago. His smoking use included cigars and cigarettes. He quit smokeless tobacco use about 31 years ago. He reports current alcohol use. He reports that he does not use drugs.  ROS: UROLOGY Frequent Urination?: No Hard to postpone urination?: No Burning/pain with urination?: No Get up at night to urinate?: Yes Leakage of urine?: No Urine stream starts and stops?: No Trouble starting stream?: No Do you have to strain to urinate?: No Blood in urine?: No Urinary tract infection?: No Sexually transmitted disease?: No Injury to kidneys or bladder?: No Painful intercourse?: No Weak stream?: Yes Erection problems?: No Penile pain?: No  Gastrointestinal Nausea?: No Vomiting?: No Indigestion/heartburn?: No Diarrhea?: No Constipation?: Yes  Constitutional Fever: No Night sweats?: No Weight loss?: No Fatigue?: No  Skin Skin rash/lesions?: No Itching?: No  Eyes Blurred vision?: Yes Double vision?: Yes  Ears/Nose/Throat Sore throat?: No Sinus problems?: No  Hematologic/Lymphatic Swollen glands?: No Easy bruising?: No  Cardiovascular Leg swelling?: No Chest pain?: No  Respiratory Cough?: No Shortness of breath?: No  Endocrine Excessive thirst?: No  Musculoskeletal Back pain?: No Joint pain?: No  Neurological Headaches?: No Dizziness?: No  Psychologic Depression?: Yes Anxiety?: No  Physical Exam: BP 133/66 (BP Location: Left Arm, Patient Position: Sitting, Cuff Size: Normal)   Pulse 67   Ht 5\' 10"  (1.778 m)   Wt 178 lb 14.4 oz (81.1 kg)   BMI 25.67 kg/m   Constitutional:  Alert and oriented, No acute distress. HEENT: [place] AT, moist mucus membranes.  Trachea midline, no masses. Cardiovascular: No clubbing, cyanosis, or edema. Respiratory: Normal  respiratory effort, no increased work of breathing. Skin: No rashes, bruises or suspicious lesions. Neurologic: Grossly intact, no focal deficits, moving all 4 extremities. Psychiatric: Normal mood and affect.   Assessment & Plan:    - BPH with lower urinary tract symptoms Stable voiding symptoms on tamsulosin.  Refill sent to pharmacy.   [name], [place] [address], [place], [place] [address] [phone]

## 2019-09-05 DEATH — deceased

## 2020-06-29 ENCOUNTER — Ambulatory Visit: Payer: Self-pay | Admitting: Urology
